# Patient Record
Sex: Female | Born: 1975 | Race: White | Hispanic: No | Marital: Married | Smoking: Current every day smoker
Health system: Southern US, Community
[De-identification: ages and names within clinical notes are randomized; demographics above are authoritative.]

## PROBLEM LIST (undated history)

## (undated) DIAGNOSIS — M549 Dorsalgia, unspecified: Secondary | ICD-10-CM

## (undated) DIAGNOSIS — R569 Unspecified convulsions: Secondary | ICD-10-CM

## (undated) DIAGNOSIS — F419 Anxiety disorder, unspecified: Secondary | ICD-10-CM

## (undated) DIAGNOSIS — I999 Unspecified disorder of circulatory system: Secondary | ICD-10-CM

## (undated) DIAGNOSIS — I82409 Acute embolism and thrombosis of unspecified deep veins of unspecified lower extremity: Secondary | ICD-10-CM

## (undated) DIAGNOSIS — F319 Bipolar disorder, unspecified: Secondary | ICD-10-CM

## (undated) DIAGNOSIS — G8929 Other chronic pain: Secondary | ICD-10-CM

## (undated) DIAGNOSIS — G4730 Sleep apnea, unspecified: Secondary | ICD-10-CM

## (undated) HISTORY — PX: SHOULDER SURGERY: SHX246

---

## 2012-11-26 ENCOUNTER — Emergency Department: Payer: Self-pay | Admitting: Emergency Medicine

## 2013-02-12 ENCOUNTER — Other Ambulatory Visit: Payer: Self-pay | Admitting: Physician Assistant

## 2013-02-12 LAB — CBC WITH DIFFERENTIAL/PLATELET
Basophil #: 0.1 10*3/uL (ref 0.0–0.1)
Eosinophil #: 0.2 10*3/uL (ref 0.0–0.7)
HCT: 45.2 % (ref 35.0–47.0)
HGB: 15.6 g/dL (ref 12.0–16.0)
Lymphocyte #: 2.4 10*3/uL (ref 1.0–3.6)
Lymphocyte %: 36.1 %
MCHC: 34.5 g/dL (ref 32.0–36.0)
MCV: 86 fL (ref 80–100)
Neutrophil %: 54.2 %
Platelet: 321 10*3/uL (ref 150–440)
RDW: 13.3 % (ref 11.5–14.5)
WBC: 6.5 10*3/uL (ref 3.6–11.0)

## 2013-02-12 LAB — COMPREHENSIVE METABOLIC PANEL
BUN: 6 mg/dL — ABNORMAL LOW (ref 7–18)
Bilirubin,Total: 0.2 mg/dL (ref 0.2–1.0)
Chloride: 104 mmol/L (ref 98–107)
Co2: 24 mmol/L (ref 21–32)
Creatinine: 0.77 mg/dL (ref 0.60–1.30)
EGFR (Non-African Amer.): >60
Glucose: 94 mg/dL (ref 65–99)
Osmolality: 267 (ref 275–301)
Potassium: 4.2 mmol/L (ref 3.5–5.1)
SGOT(AST): 86 U/L — ABNORMAL HIGH (ref 15–37)
Sodium: 135 mmol/L — ABNORMAL LOW (ref 136–145)
Total Protein: 7.9 g/dL (ref 6.4–8.2)

## 2013-02-12 LAB — PROTIME-INR: INR: 1.3

## 2013-05-17 LAB — CBC
HGB: 13.4 g/dL (ref 12.0–16.0)
MCH: 27.8 pg (ref 26.0–34.0)
MCHC: 33.2 g/dL (ref 32.0–36.0)
RBC: 4.82 10*6/uL (ref 3.80–5.20)

## 2013-05-17 LAB — BASIC METABOLIC PANEL
Anion Gap: 8 (ref 7–16)
Chloride: 106 mmol/L (ref 98–107)
Creatinine: 0.86 mg/dL (ref 0.60–1.30)
EGFR (Non-African Amer.): >60
Glucose: 87 mg/dL (ref 65–99)
Sodium: 136 mmol/L (ref 136–145)

## 2013-05-18 ENCOUNTER — Inpatient Hospital Stay: Payer: Self-pay | Admitting: Internal Medicine

## 2013-05-18 LAB — PROTIME-INR: Prothrombin Time: 15.1 secs — ABNORMAL HIGH (ref 11.5–14.7)

## 2013-05-18 LAB — PREGNANCY, URINE: Pregnancy Test, Urine: NEGATIVE m[IU]/mL

## 2013-05-19 LAB — CBC WITH DIFFERENTIAL/PLATELET
Basophil %: 0.6 %
Eosinophil #: 0.3 10*3/uL (ref 0.0–0.7)
Eosinophil %: 2.4 %
HCT: 33.1 % — ABNORMAL LOW (ref 35.0–47.0)
Lymphocyte #: 2.5 10*3/uL (ref 1.0–3.6)
Neutrophil #: 7.5 10*3/uL — ABNORMAL HIGH (ref 1.4–6.5)
Neutrophil %: 66.2 %
Platelet: 466 10*3/uL — ABNORMAL HIGH (ref 150–440)
RDW: 15.6 % — ABNORMAL HIGH (ref 11.5–14.5)
WBC: 11.3 10*3/uL — ABNORMAL HIGH (ref 3.6–11.0)

## 2013-05-19 LAB — BASIC METABOLIC PANEL WITH GFR
Anion Gap: 6 — ABNORMAL LOW (ref 7–16)
BUN: 7 mg/dL (ref 7–18)
Calcium, Total: 8.1 mg/dL — ABNORMAL LOW (ref 8.5–10.1)
Chloride: 104 mmol/L (ref 98–107)
Co2: 26 mmol/L (ref 21–32)
Creatinine: 0.89 mg/dL (ref 0.60–1.30)
EGFR (African American): >60
EGFR (Non-African Amer.): >60
Glucose: 90 mg/dL (ref 65–99)
Osmolality: 269 (ref 275–301)
Potassium: 3.5 mmol/L (ref 3.5–5.1)
Sodium: 136 mmol/L (ref 136–145)

## 2013-05-19 LAB — VANCOMYCIN, TROUGH: Vancomycin, Trough: 35 ug/mL (ref 10–20)

## 2013-05-20 LAB — CBC WITH DIFFERENTIAL/PLATELET
Basophil #: 0.1 10*3/uL (ref 0.0–0.1)
Basophil %: 0.5 %
Eosinophil %: 2.2 %
HGB: 9.3 g/dL — ABNORMAL LOW (ref 12.0–16.0)
Lymphocyte #: 2 10*3/uL (ref 1.0–3.6)
MCH: 27.9 pg (ref 26.0–34.0)
Monocyte #: 0.8 x10 3/mm (ref 0.2–0.9)
Monocyte %: 7.5 %
Platelet: 416 10*3/uL (ref 150–440)
RBC: 3.34 10*6/uL — ABNORMAL LOW (ref 3.80–5.20)
WBC: 10.6 10*3/uL (ref 3.6–11.0)

## 2013-05-20 LAB — BASIC METABOLIC PANEL
Calcium, Total: 6.5 mg/dL — CL (ref 8.5–10.1)
Co2: 27 mmol/L (ref 21–32)
Creatinine: 0.74 mg/dL (ref 0.60–1.30)
EGFR (African American): >60
EGFR (Non-African Amer.): >60
Glucose: 81 mg/dL (ref 65–99)
Osmolality: 275 (ref 275–301)
Sodium: 140 mmol/L (ref 136–145)

## 2013-05-21 LAB — BASIC METABOLIC PANEL
Anion Gap: 4 — ABNORMAL LOW (ref 7–16)
BUN: 4 mg/dL — ABNORMAL LOW (ref 7–18)
Calcium, Total: 8.2 mg/dL — ABNORMAL LOW (ref 8.5–10.1)
Chloride: 103 mmol/L (ref 98–107)
EGFR (African American): >60
EGFR (Non-African Amer.): >60

## 2013-05-22 LAB — VANCOMYCIN, TROUGH: Vancomycin, Trough: 13 ug/mL (ref 10–20)

## 2013-05-22 LAB — CULTURE, BLOOD (SINGLE)

## 2013-05-22 LAB — PROTIME-INR: Prothrombin Time: 13.4 secs (ref 11.5–14.7)

## 2013-05-24 LAB — PROTIME-INR
INR: 1.1
Prothrombin Time: 14.6 s

## 2013-05-24 LAB — CREATININE, SERUM
Creatinine: 0.77 mg/dL (ref 0.60–1.30)
EGFR (African American): >60

## 2013-05-24 LAB — HEMOGLOBIN: HGB: 11.2 g/dL — ABNORMAL LOW

## 2013-05-25 LAB — VANCOMYCIN, TROUGH: Vancomycin, Trough: 18 ug/mL (ref 10–20)

## 2013-05-26 LAB — BASIC METABOLIC PANEL
Anion Gap: 1 — ABNORMAL LOW (ref 7–16)
Calcium, Total: 8 mg/dL — ABNORMAL LOW (ref 8.5–10.1)
Co2: 34 mmol/L — ABNORMAL HIGH (ref 21–32)
Creatinine: 0.84 mg/dL (ref 0.60–1.30)
EGFR (Non-African Amer.): >60
Glucose: 92 mg/dL (ref 65–99)
Osmolality: 269 (ref 275–301)
Sodium: 136 mmol/L (ref 136–145)

## 2013-05-26 LAB — PLATELET COUNT: Platelet: 438 10*3/uL (ref 150–440)

## 2013-05-26 LAB — PROTIME-INR: INR: 1.5

## 2013-05-27 LAB — PROTIME-INR: Prothrombin Time: 20.7 secs — ABNORMAL HIGH (ref 11.5–14.7)

## 2013-05-28 LAB — PROTIME-INR
INR: 2
Prothrombin Time: 22.5 secs — ABNORMAL HIGH (ref 11.5–14.7)

## 2013-05-28 LAB — WOUND CULTURE

## 2013-05-28 LAB — HEMOGLOBIN: HGB: 11.2 g/dL — ABNORMAL LOW (ref 12.0–16.0)

## 2013-05-29 ENCOUNTER — Inpatient Hospital Stay: Payer: Self-pay | Admitting: Internal Medicine

## 2013-05-29 LAB — RAPID INFLUENZA A&B ANTIGENS

## 2013-05-29 LAB — COMPREHENSIVE METABOLIC PANEL
Albumin: 2.7 g/dL — ABNORMAL LOW (ref 3.4–5.0)
Alkaline Phosphatase: 84 U/L
Anion Gap: 4 — ABNORMAL LOW (ref 7–16)
BUN: 5 mg/dL — ABNORMAL LOW (ref 7–18)
Bilirubin,Total: 0.2 mg/dL (ref 0.2–1.0)
Calcium, Total: 8.5 mg/dL (ref 8.5–10.1)
Chloride: 98 mmol/L (ref 98–107)
Creatinine: 1 mg/dL (ref 0.60–1.30)
EGFR (Non-African Amer.): >60
Glucose: 100 mg/dL — ABNORMAL HIGH (ref 65–99)
Osmolality: 254 (ref 275–301)
SGOT(AST): 29 U/L (ref 15–37)
SGPT (ALT): 22 U/L (ref 12–78)
Total Protein: 7.9 g/dL (ref 6.4–8.2)

## 2013-05-29 LAB — CBC WITH DIFFERENTIAL/PLATELET
Basophil #: 0.1 10*3/uL (ref 0.0–0.1)
Basophil %: 0.8 %
HCT: 35.6 % (ref 35.0–47.0)
HGB: 11.8 g/dL — ABNORMAL LOW (ref 12.0–16.0)
Lymphocyte %: 10.5 %
MCHC: 33.2 g/dL (ref 32.0–36.0)
Monocyte %: 6.1 %
Neutrophil #: 10.3 10*3/uL — ABNORMAL HIGH (ref 1.4–6.5)
Neutrophil %: 81.6 %
RBC: 4.31 10*6/uL (ref 3.80–5.20)
WBC: 12.7 10*3/uL — ABNORMAL HIGH (ref 3.6–11.0)

## 2013-05-29 LAB — URINALYSIS, COMPLETE
Bilirubin,UR: NEGATIVE
Ketone: NEGATIVE
Nitrite: NEGATIVE
Ph: 5 (ref 4.5–8.0)
Protein: NEGATIVE
Specific Gravity: 1.017 (ref 1.003–1.030)
Squamous Epithelial: 2

## 2013-05-29 LAB — MAGNESIUM: Magnesium: 1.8 mg/dL

## 2013-05-29 LAB — PROTIME-INR: Prothrombin Time: 26.5 secs — ABNORMAL HIGH (ref 11.5–14.7)

## 2013-05-30 LAB — BASIC METABOLIC PANEL
BUN: 8 mg/dL (ref 7–18)
Chloride: 99 mmol/L (ref 98–107)
Creatinine: 1.18 mg/dL (ref 0.60–1.30)
EGFR (African American): >60
EGFR (Non-African Amer.): 58 — ABNORMAL LOW
Glucose: 126 mg/dL — ABNORMAL HIGH (ref 65–99)
Potassium: 3.6 mmol/L (ref 3.5–5.1)
Sodium: 134 mmol/L — ABNORMAL LOW (ref 136–145)

## 2013-05-30 LAB — CBC WITH DIFFERENTIAL/PLATELET
Basophil #: 0.1 10*3/uL (ref 0.0–0.1)
Basophil %: 0.8 %
Eosinophil #: 0.1 10*3/uL (ref 0.0–0.7)
HCT: 30.9 % — ABNORMAL LOW (ref 35.0–47.0)
MCH: 27.1 pg (ref 26.0–34.0)
Monocyte #: 1.4 x10 3/mm — ABNORMAL HIGH (ref 0.2–0.9)
Monocyte %: 10 %
Neutrophil #: 10.5 10*3/uL — ABNORMAL HIGH (ref 1.4–6.5)
RBC: 3.74 10*6/uL — ABNORMAL LOW (ref 3.80–5.20)
RDW: 15.6 % — ABNORMAL HIGH (ref 11.5–14.5)

## 2013-05-30 LAB — PROTIME-INR
INR: 3.4
Prothrombin Time: 33.3 secs — ABNORMAL HIGH (ref 11.5–14.7)

## 2013-05-31 LAB — CBC WITH DIFFERENTIAL/PLATELET
Basophil #: 0.1 10*3/uL (ref 0.0–0.1)
Basophil %: 0.8 %
Eosinophil #: 0.2 10*3/uL (ref 0.0–0.7)
Eosinophil %: 1.9 %
HCT: 30.4 % — ABNORMAL LOW (ref 35.0–47.0)
HGB: 10 g/dL — ABNORMAL LOW (ref 12.0–16.0)
Lymphocyte #: 1.7 10*3/uL (ref 1.0–3.6)
Lymphocyte %: 17.3 %
MCH: 27.2 pg (ref 26.0–34.0)
MCHC: 32.9 g/dL (ref 32.0–36.0)
MCV: 83 fL (ref 80–100)
Monocyte #: 0.7 x10 3/mm (ref 0.2–0.9)
Monocyte %: 7 %
Neutrophil #: 7.1 10*3/uL — ABNORMAL HIGH (ref 1.4–6.5)
Neutrophil %: 73 %
Platelet: 317 10*3/uL (ref 150–440)
RBC: 3.68 10*6/uL — ABNORMAL LOW (ref 3.80–5.20)
RDW: 15.8 % — ABNORMAL HIGH (ref 11.5–14.5)
WBC: 9.8 10*3/uL (ref 3.6–11.0)

## 2013-05-31 LAB — PROTIME-INR
INR: 2.7
Prothrombin Time: 28.1 secs — ABNORMAL HIGH (ref 11.5–14.7)

## 2013-05-31 LAB — URINE CULTURE

## 2013-05-31 LAB — PREGNANCY, URINE: PREGNANCY TEST, URINE: NEGATIVE m[IU]/mL

## 2013-06-01 LAB — PROTIME-INR
INR: 2.8
Prothrombin Time: 28.6 secs — ABNORMAL HIGH (ref 11.5–14.7)

## 2013-06-02 LAB — WOUND CULTURE

## 2013-06-03 LAB — CULTURE, BLOOD (SINGLE)

## 2013-06-04 ENCOUNTER — Emergency Department: Payer: Self-pay | Admitting: Surgery

## 2013-06-16 ENCOUNTER — Inpatient Hospital Stay: Payer: Self-pay | Admitting: Internal Medicine

## 2013-06-16 LAB — COMPREHENSIVE METABOLIC PANEL
ALBUMIN: 3.6 g/dL (ref 3.4–5.0)
ANION GAP: 10 (ref 7–16)
AST: 32 U/L (ref 15–37)
Alkaline Phosphatase: 74 U/L
BUN: 75 mg/dL — ABNORMAL HIGH (ref 7–18)
Bilirubin,Total: 0.7 mg/dL (ref 0.2–1.0)
CALCIUM: 8.3 mg/dL — AB (ref 8.5–10.1)
CHLORIDE: 98 mmol/L (ref 98–107)
CREATININE: 8.57 mg/dL — AB (ref 0.60–1.30)
Co2: 26 mmol/L (ref 21–32)
EGFR (Non-African Amer.): 5 — ABNORMAL LOW
GFR CALC AF AMER: 6 — AB
Glucose: 105 mg/dL — ABNORMAL HIGH (ref 65–99)
OSMOLALITY: 291 (ref 275–301)
POTASSIUM: 3.3 mmol/L — AB (ref 3.5–5.1)
SGPT (ALT): 91 U/L — ABNORMAL HIGH (ref 12–78)
Sodium: 134 mmol/L — ABNORMAL LOW (ref 136–145)
Total Protein: 8.2 g/dL (ref 6.4–8.2)

## 2013-06-16 LAB — CBC WITH DIFFERENTIAL/PLATELET
BASOS PCT: 1.8 %
Basophil #: 0.1 10*3/uL (ref 0.0–0.1)
EOS PCT: 4.5 %
Eosinophil #: 0.3 10*3/uL (ref 0.0–0.7)
HCT: 33.5 % — ABNORMAL LOW (ref 35.0–47.0)
HGB: 11.2 g/dL — ABNORMAL LOW (ref 12.0–16.0)
Lymphocyte #: 1 10*3/uL (ref 1.0–3.6)
Lymphocyte %: 17.6 %
MCH: 26.3 pg (ref 26.0–34.0)
MCHC: 33.4 g/dL (ref 32.0–36.0)
MCV: 79 fL — ABNORMAL LOW (ref 80–100)
MONOS PCT: 10.2 %
Monocyte #: 0.6 x10 3/mm (ref 0.2–0.9)
NEUTROS PCT: 65.9 %
Neutrophil #: 3.8 10*3/uL (ref 1.4–6.5)
Platelet: 295 10*3/uL (ref 150–440)
RBC: 4.25 10*6/uL (ref 3.80–5.20)
RDW: 17.5 % — ABNORMAL HIGH (ref 11.5–14.5)
WBC: 5.7 10*3/uL (ref 3.6–11.0)

## 2013-06-16 LAB — URINALYSIS, COMPLETE
Bilirubin,UR: NEGATIVE
Glucose,UR: NEGATIVE mg/dL (ref 0–75)
KETONE: NEGATIVE
Leukocyte Esterase: NEGATIVE
Nitrite: NEGATIVE
Ph: 6 (ref 4.5–8.0)
Protein: 30
RBC,UR: 2 /HPF (ref 0–5)
Specific Gravity: 1.011 (ref 1.003–1.030)
Squamous Epithelial: 2

## 2013-06-16 LAB — PREGNANCY, URINE: Pregnancy Test, Urine: NEGATIVE m[IU]/mL

## 2013-06-17 LAB — CBC WITH DIFFERENTIAL/PLATELET
BASOS ABS: 0.1 10*3/uL (ref 0.0–0.1)
BASOS PCT: 2.3 %
EOS ABS: 0.3 10*3/uL (ref 0.0–0.7)
Eosinophil %: 4.2 %
HCT: 32.6 % — ABNORMAL LOW (ref 35.0–47.0)
HGB: 10.7 g/dL — ABNORMAL LOW (ref 12.0–16.0)
Lymphocyte #: 1.7 10*3/uL (ref 1.0–3.6)
Lymphocyte %: 28.6 %
MCH: 25.9 pg — ABNORMAL LOW (ref 26.0–34.0)
MCHC: 32.9 g/dL (ref 32.0–36.0)
MCV: 79 fL — ABNORMAL LOW (ref 80–100)
Monocyte #: 0.7 x10 3/mm (ref 0.2–0.9)
Monocyte %: 11.5 %
NEUTROS ABS: 3.3 10*3/uL (ref 1.4–6.5)
NEUTROS PCT: 53.4 %
Platelet: 287 10*3/uL (ref 150–440)
RBC: 4.15 10*6/uL (ref 3.80–5.20)
RDW: 17.3 % — ABNORMAL HIGH (ref 11.5–14.5)
WBC: 6.1 10*3/uL (ref 3.6–11.0)

## 2013-06-17 LAB — BASIC METABOLIC PANEL
Anion Gap: 11 (ref 7–16)
BUN: 62 mg/dL — ABNORMAL HIGH (ref 7–18)
CALCIUM: 7.8 mg/dL — AB (ref 8.5–10.1)
Chloride: 102 mmol/L (ref 98–107)
Co2: 23 mmol/L (ref 21–32)
Creatinine: 7.36 mg/dL — ABNORMAL HIGH (ref 0.60–1.30)
EGFR (African American): 7 — ABNORMAL LOW
EGFR (Non-African Amer.): 6 — ABNORMAL LOW
Glucose: 92 mg/dL (ref 65–99)
Osmolality: 289 (ref 275–301)
Potassium: 3.4 mmol/L — ABNORMAL LOW (ref 3.5–5.1)
SODIUM: 136 mmol/L (ref 136–145)

## 2013-06-17 LAB — URINALYSIS, COMPLETE
Bacteria: NONE SEEN
Bilirubin,UR: NEGATIVE
Glucose,UR: NEGATIVE mg/dL (ref 0–75)
Ketone: NEGATIVE
Leukocyte Esterase: NEGATIVE
Nitrite: NEGATIVE
Ph: 6 (ref 4.5–8.0)
Protein: NEGATIVE
RBC,UR: 1 /HPF (ref 0–5)
SPECIFIC GRAVITY: 1.009 (ref 1.003–1.030)
Squamous Epithelial: 1

## 2013-06-17 LAB — MAGNESIUM: MAGNESIUM: 1.8 mg/dL

## 2013-06-17 LAB — PROTIME-INR
INR: 1.4
Prothrombin Time: 16.6 secs — ABNORMAL HIGH (ref 11.5–14.7)

## 2013-06-18 LAB — BASIC METABOLIC PANEL
ANION GAP: 10 (ref 7–16)
BUN: 47 mg/dL — AB (ref 7–18)
CHLORIDE: 103 mmol/L (ref 98–107)
CO2: 22 mmol/L (ref 21–32)
CREATININE: 5.8 mg/dL — AB (ref 0.60–1.30)
Calcium, Total: 7.6 mg/dL — ABNORMAL LOW (ref 8.5–10.1)
EGFR (African American): 10 — ABNORMAL LOW
GFR CALC NON AF AMER: 9 — AB
Glucose: 81 mg/dL (ref 65–99)
Osmolality: 281 (ref 275–301)
Potassium: 3.2 mmol/L — ABNORMAL LOW (ref 3.5–5.1)
Sodium: 135 mmol/L — ABNORMAL LOW (ref 136–145)

## 2013-06-18 LAB — CBC WITH DIFFERENTIAL/PLATELET
BASOS PCT: 2.5 %
Basophil #: 0.1 10*3/uL (ref 0.0–0.1)
EOS PCT: 3.9 %
Eosinophil #: 0.2 10*3/uL (ref 0.0–0.7)
HCT: 31.2 % — ABNORMAL LOW (ref 35.0–47.0)
HGB: 10.1 g/dL — ABNORMAL LOW (ref 12.0–16.0)
LYMPHS ABS: 1.7 10*3/uL (ref 1.0–3.6)
LYMPHS PCT: 32.2 %
MCH: 26.4 pg (ref 26.0–34.0)
MCHC: 32.5 g/dL (ref 32.0–36.0)
MCV: 81 fL (ref 80–100)
MONOS PCT: 9.6 %
Monocyte #: 0.5 x10 3/mm (ref 0.2–0.9)
NEUTROS ABS: 2.7 10*3/uL (ref 1.4–6.5)
Neutrophil %: 51.8 %
Platelet: 235 10*3/uL (ref 150–440)
RBC: 3.84 10*6/uL (ref 3.80–5.20)
RDW: 17.1 % — ABNORMAL HIGH (ref 11.5–14.5)
WBC: 5.2 10*3/uL (ref 3.6–11.0)

## 2013-06-19 LAB — BASIC METABOLIC PANEL
Anion Gap: 8 (ref 7–16)
BUN: 35 mg/dL — AB (ref 7–18)
CHLORIDE: 105 mmol/L (ref 98–107)
CO2: 24 mmol/L (ref 21–32)
Calcium, Total: 7.7 mg/dL — ABNORMAL LOW (ref 8.5–10.1)
Creatinine: 4.61 mg/dL — ABNORMAL HIGH (ref 0.60–1.30)
EGFR (African American): 13 — ABNORMAL LOW
GFR CALC NON AF AMER: 11 — AB
Glucose: 97 mg/dL (ref 65–99)
OSMOLALITY: 282 (ref 275–301)
Potassium: 3 mmol/L — ABNORMAL LOW (ref 3.5–5.1)
SODIUM: 137 mmol/L (ref 136–145)

## 2013-06-19 LAB — UR PROT ELECTROPHORESIS, URINE RANDOM

## 2013-06-19 LAB — MAGNESIUM: Magnesium: 1.3 mg/dL — ABNORMAL LOW

## 2013-06-20 LAB — BASIC METABOLIC PANEL
Anion Gap: 8 (ref 7–16)
BUN: 29 mg/dL — ABNORMAL HIGH (ref 7–18)
CHLORIDE: 104 mmol/L (ref 98–107)
CO2: 21 mmol/L (ref 21–32)
Calcium, Total: 8.6 mg/dL (ref 8.5–10.1)
Creatinine: 3.78 mg/dL — ABNORMAL HIGH (ref 0.60–1.30)
EGFR (Non-African Amer.): 14 — ABNORMAL LOW
GFR CALC AF AMER: 17 — AB
Glucose: 93 mg/dL (ref 65–99)
OSMOLALITY: 272 (ref 275–301)
Potassium: 3.3 mmol/L — ABNORMAL LOW (ref 3.5–5.1)
SODIUM: 133 mmol/L — AB (ref 136–145)

## 2013-06-20 LAB — PATHOLOGY REPORT

## 2013-06-20 LAB — MAGNESIUM: Magnesium: 2.2 mg/dL

## 2013-06-21 LAB — CULTURE, BLOOD (SINGLE)

## 2013-06-21 LAB — BASIC METABOLIC PANEL
Anion Gap: 7 (ref 7–16)
BUN: 26 mg/dL — ABNORMAL HIGH (ref 7–18)
CO2: 25 mmol/L (ref 21–32)
Calcium, Total: 8.6 mg/dL (ref 8.5–10.1)
Chloride: 103 mmol/L (ref 98–107)
Creatinine: 3.49 mg/dL — ABNORMAL HIGH (ref 0.60–1.30)
EGFR (African American): 18 — ABNORMAL LOW
GFR CALC NON AF AMER: 16 — AB
GLUCOSE: 104 mg/dL — AB (ref 65–99)
OSMOLALITY: 275 (ref 275–301)
Potassium: 3.3 mmol/L — ABNORMAL LOW (ref 3.5–5.1)
Sodium: 135 mmol/L — ABNORMAL LOW (ref 136–145)

## 2013-06-21 LAB — LIPASE, BLOOD: Lipase: 933 U/L — ABNORMAL HIGH (ref 73–393)

## 2013-06-21 LAB — PROTEIN ELECTROPHORESIS(ARMC)

## 2013-06-22 LAB — CREATININE, SERUM
Creatinine: 3.3 mg/dL — ABNORMAL HIGH (ref 0.60–1.30)
EGFR (Non-African Amer.): 17 — ABNORMAL LOW
GFR CALC AF AMER: 20 — AB

## 2013-06-22 LAB — LIPASE, BLOOD: LIPASE: 737 U/L — AB (ref 73–393)

## 2013-06-22 LAB — TRIGLYCERIDES: Triglycerides: 253 mg/dL — ABNORMAL HIGH (ref 0–200)

## 2013-08-05 ENCOUNTER — Emergency Department: Payer: Self-pay | Admitting: Internal Medicine

## 2013-08-05 LAB — CBC
HCT: 40 % (ref 35.0–47.0)
HGB: 13.4 g/dL (ref 12.0–16.0)
MCH: 28.1 pg (ref 26.0–34.0)
MCHC: 33.6 g/dL (ref 32.0–36.0)
MCV: 84 fL (ref 80–100)
Platelet: 323 10*3/uL (ref 150–440)
RBC: 4.78 10*6/uL (ref 3.80–5.20)
RDW: 18.4 % — ABNORMAL HIGH (ref 11.5–14.5)
WBC: 5.5 10*3/uL (ref 3.6–11.0)

## 2013-08-05 LAB — COMPREHENSIVE METABOLIC PANEL
ALK PHOS: 103 U/L
ALT: 15 U/L (ref 12–78)
ANION GAP: 9 (ref 7–16)
AST: 26 U/L (ref 15–37)
Albumin: 3.2 g/dL — ABNORMAL LOW (ref 3.4–5.0)
BUN: 9 mg/dL (ref 7–18)
Bilirubin,Total: 0.9 mg/dL (ref 0.2–1.0)
Calcium, Total: 8.8 mg/dL (ref 8.5–10.1)
Chloride: 106 mmol/L (ref 98–107)
Co2: 19 mmol/L — ABNORMAL LOW (ref 21–32)
Creatinine: 0.88 mg/dL (ref 0.60–1.30)
EGFR (African American): >60
Glucose: 91 mg/dL (ref 65–99)
Osmolality: 267 (ref 275–301)
POTASSIUM: 4.5 mmol/L (ref 3.5–5.1)
Sodium: 134 mmol/L — ABNORMAL LOW (ref 136–145)
Total Protein: 7.9 g/dL (ref 6.4–8.2)

## 2013-08-05 LAB — URINALYSIS, COMPLETE
BACTERIA: NONE SEEN
BILIRUBIN, UR: NEGATIVE
BLOOD: NEGATIVE
Glucose,UR: NEGATIVE mg/dL (ref 0–75)
Ketone: NEGATIVE
Leukocyte Esterase: NEGATIVE
NITRITE: NEGATIVE
Ph: 6 (ref 4.5–8.0)
Protein: NEGATIVE
Specific Gravity: 1.017 (ref 1.003–1.030)
Squamous Epithelial: 11
WBC UR: 3 /HPF (ref 0–5)

## 2013-08-05 LAB — LIPASE, BLOOD: Lipase: 101 U/L (ref 73–393)

## 2013-09-13 ENCOUNTER — Ambulatory Visit: Payer: Self-pay | Admitting: Surgery

## 2013-10-01 ENCOUNTER — Ambulatory Visit: Payer: Self-pay | Admitting: Orthopaedic Surgery

## 2013-10-29 ENCOUNTER — Ambulatory Visit: Payer: Self-pay | Admitting: Internal Medicine

## 2014-09-20 NOTE — Op Note (Signed)
PATIENT NAME:  Courtney BambergerFOSTER, Brighid V MR#:  [id number] DATE OF BIRTH:  [birth year]  DATE OF PROCEDURE:  05/23/2013  PREOPERATIVE DIAGNOSIS: Multiple skin abscesses.  POSTOPERATIVE DIAGNOSIS: Multiple skin abscesses.  OPERATION:  1.  wound VAC change, right arm.  2.  Multiple dressing changes.  3.  Incision and drainage to her left chest wall abscess.   ANESTHESIA: General.   SURGEON: Quentin Orealph L. Ely, MD   OPERATIVE PROCEDURE: With the patient in the supine position after the induction of appropriate general anesthesia, the patient's multiple abscesses were undressed and prepped with Betadine and wound dressings accomplished. The foot wound appeared to be completely filled in other than the epithelium and so it was dressed with a wet-to-dry dressing. We will transition to a DuoDERM dressing. The right upper arm wound VAC was changed in the standard fashion. She had a new abscess in the left forearm along the left chest, both of which were incised, drained, packed and cultured. The patient was awakened and returned to the recovery room and tolerated the procedure well. Sponge, instrument and needle counts were correct x 2 in the operating room.   ____________________________ Quentin Orealph L. Ely III, MD rle:aw D: 05/23/2013 11:08:00 ET T: 05/23/2013 11:15:15 ET JOB#: [id number]  cc: Quentin Orealph L. Ely III, MD, <Dictator> Quentin OreALPH L ELY MD ELECTRONICALLY SIGNED 05/23/2013 17:30

## 2014-09-20 NOTE — Consult Note (Signed)
Brief Consult Note: Diagnosis: multiple MRSA abscesses.   Patient was seen by consultant.   Recommend to proceed with surgery or procedure.   Comments: Very pleasant MO pt with multiple MRSA abscesses. The two drained in the ED: R upper arm, and R ankle dorsal surface both have significant amount of skin necrosis, and would benefit from operative debridement and further drainage. She has new, non-necrotic abscesses on L foot, L groin, and R upper arm (separate from necrotic one). The groin has spontaneously (and adequately) drained, but the other two may benefit from drainage as well.  Pt just ate, and will require general anesthesia, so posted in OR for tomorrow. I will let my partner, Dr. Egbert GaribaldiBird decide on dressing(s) vs wound VAC(s), as he will be doing the procedure..  Electronic Signatures: Claude MangesMarterre, Carnie Bruemmer F (MD)  (Signed 19-Dec-14 20:57)  Authored: Brief Consult Note   Last Updated: 19-Dec-14 20:57 by Claude MangesMarterre, Rhianna Raulerson F (MD)

## 2014-09-20 NOTE — Consult Note (Signed)
PATIENT NAME:  Courtney Hebert, Courtney Hebert MR#:  [id number] DATE OF BIRTH:  1975/07/16  DATE OF CONSULTATION:  05/30/2013  CONSULTING PHYSICIAN:  Cristal Deerhristopher A. Lorraine Cimmino, MD  REASON FOR CONSULTATION: Persistent fever, leukocytosis, right foot and right arm abscesses which had previously been drained.   HISTORY OF PRESENT ILLNESS: Ms. Courtney Hebert is a pleasant 39 year old female who was admitted from December 18th until December 29th with MRSA skin infections which had been I and D'd and wound VAC placed. She was discharged to home on the 29th with IV vancomycin and PICC line, and she returns with fever. She otherwise has been doing fine. Her right foot wound has been getting red and was oozing, according to the boyfriend. Otherwise, no issues. No chest pain, shortness of breath, cough, headache, abdominal pain, nausea, vomiting, diarrhea, constipation, dysuria or hematuria.   PAST MEDICAL HISTORY:  1. DVT, on chronic anticoagulation with warfarin.  2. History of recurrent skin infection with MRSA.  3. History of C-section.  4. History of left arm orthopedic surgery.   HOME MEDICATIONS:  1. Oxycodone.  2. Coumadin.  3. Xanax.  4. Nystatin powder to groin.  5. Colace. 6. Nicotine patch. 7. Vancomycin IV. 8. Morphine.   FAMILY HISTORY: Significant for CVA.   SOCIAL HISTORY: Tobacco use 1-1/2 to 2 packs a day. Denies alcohol or other drugs.   REVIEW OF SYSTEMS: A 12-point review of systems  obtained. Pertinent positives and negatives as above.   PHYSICAL EXAMINATION:  VITAL SIGNS: Temperature 99.2, pulse 104, blood pressure 122/86, respirations 18, 96% on room air.  GENERAL: In no acute distress. Alert and oriented x3.  HEENT: Head: Normocephalic, atraumatic. Eyes: No scleral icterus. No conjunctivitis. Face: No obvious facial trauma. Normal external nose. Normal external ears.  CHEST: Lungs clear to auscultation, moving air well.  HEART: Regular rate and rhythm. No murmurs, rubs or  gallops. ABDOMEN: Soft, nontender, nondistended.  EXTREMITIES: Has a wound VAC in place on the right medial arm. No obvious redness or drainage. Relatively nontender. Foot has an approximately 1 x 1 cm area of granulation with fibrinous exudate. No swelling. No erythema. No obvious abscess.   LABORATORY DATA: As follows: White cell count 14.2, otherwise unremarkable.   ASSESSMENT AND PLAN: Ms. Courtney Hebert is a pleasant 39 year old female with history of recurrent MRSA abscesses. She is currently afebrile, and her right foot wound is not of concern. I will change her right arm wound later today to evaluate for any need for further debridement. Will continue to follow.   ____________________________ Si Raiderhristopher A. Theophilus Walz, MD cal:lb D: 05/30/2013 10:04:43 ET T: 05/30/2013 10:13:22 ET JOB#: [id number]  cc: Cristal Deerhristopher A. Shamiyah Ngu, MD, <Dictator> Jarvis NewcomerHRISTOPHER A Cornell Bourbon MD ELECTRONICALLY SIGNED 05/30/2013 16:11

## 2014-09-20 NOTE — Consult Note (Signed)
PATIENT NAME:  Courtney BambergerFOSTER, Kemesha V MR#:  [id number] DATE OF BIRTH:  01-Dec-1975  DATE OF CONSULTATION:  05/27/2013  REFERRING PHYSICIAN:   CONSULTING PHYSICIAN:  Tomie Spizzirri K. Kerah Hardebeck, MD  SUBJECTIVE:  The patient was seen in consultation in room number 120 along with her fiance.  The patient is a 39 year old white female, not employed and is divorced and currently she has a fiance who is 40 years old and has been living with him.  Has 2 children and they live with her parents.  The patient has a long history of mental illness and was with diagnosed bipolar disorder, depressed, and was being followed by psychiatrist.    PAST PSYCHIATRIC HISTORY:  History of inpatient psychiatry 3 times before.  Each inpatient lasted for 2 weeks.  History of suicide attempt twice by overdose of prescription pills.    MEDICATIONS:  The patient has been stabilized on the following medications:   1.  Lamictal 150 mg p.o. h.s.  2.  Lithium 300 mg at h.s.   ALCOHOL AND DRUGS:  Denied.  Does admit smoking nicotine cigarettes at rate of pack a day for many years.    The patient reports that she is not being followed by any psychiatrist in the community as [place] and [place] and currently she has been living with her f[place] for [place].    MENTAL STATUS EXAMINATION:  The patient was seen laying in bed comfortably.  Affect is appropriate with her mood which is stable.  She reports that she has been taking medication which she has at home.  Denies feeling depressed, denies feeling hopeless or helpless.  Denies feeling worthless or useless.  Denies any ideas or plans to hurt herself or others.  No psychosis.  Denies auditory or visual hallucinations.  Denies hearing voices or seeing things.  Denies paranoia or suspicious ideas.  Denies any thought insertion or thought control.  Memory is intact, cognition intact.  General information fair.  Insight and judgment fair and  adequate.  IMPRESSION:  Bipolar disorder, type depressed.  RECOMMENDATIONS:  The patient should be continued on lithium 300 mg at bedtime and Lamictal 150 mg at bedtime.  The patient is to be given followup appointment in the community by [facility] at the time of discharge.    ____________________________ Jannet MantisSurya K. Guss Bundehalla, MD skc:cs D: 05/27/2013 16:10:59 ET T: 05/27/2013 19:31:33 ET JOB#: [id number]  cc: Monika SalkSurya K. Guss Bundehalla, MD, <Dictator> Beau FannySURYA K Rafaela Dinius MD ELECTRONICALLY SIGNED 05/28/2013 9:20

## 2014-09-20 NOTE — Op Note (Signed)
PATIENT NAME:  Courtney Hebert, Courtney Hebert MR#:  [id number] DATE OF BIRTH:  06/25/1975  DATE OF PROCEDURE:  05/19/2013  PREOPERATIVE DIAGNOSIS:  Multiple methicillin resistant staphylococcus aureus abscesses, right upper arm, right lower leg, right foot, left leg, left thigh, left forearm, left arm.  POSTOPERATIVE DIAGNOSIS:  Multiple methicillin resistant staphylococcus aureus abscesses, right upper arm, right lower leg, right foot, left leg, left thigh, left forearm, left arm.   PROCEDURE PERFORMED:   1.  Placement of a wound vac less than 50 sq cm total, right upper arm.  2.  Placement of wound vac right anterior foot, right upper arm measures 7 x 3 cm, right anterior ankle 3.5 cm x 3.5 cm with multiple incision and drainage of multiple abscesses, right upper arm, right leg, right thigh, left leg, left thigh, left arm, left forearm, totaling 18.   SURGEON:  Natale LayMark Artemio Dobie, M.D.   ASSISTANT:  None.   ANESTHESIA:  General endotracheal.   DESCRIPTION OF PROCEDURE:  With informed consent, supine position, general anesthesia, the patient's existing right upper extremity necrotic abscess cavity in the right upper arm was sterilely prepped and draped along with several others in the vicinity.  Timeout was observed.  The existing dressing was removed along with necrotic eschar with sharp technique.  This wound measured 7 x 3 cm.  It was irrigated.  Hemostasis was obtained.  Black granular foam was then placed followed by occlusive dressing and track pad.  Several other abscesses on the right upper arm were drained utilizing an #11 blade in a cruciate fashion followed by 0.25 inch iodoform gauze packing, Kerlix and Ioban.  Attention was then turned to the right anterior dorsal foot wound which measured 3.5 x 3.5 cm.  This was unroofed of necrotic material and pus.  We irrigated, hemostasis obtained, black granular foam was then placed.  Seal was obtained.  Following this, multiple further abscesses were drained all in  similar fashion utilizing a cruciate incision, an #11 blade, hemostat, iodoform gauze.  This totaled 18 on the entire body.  The patient was then returned to the recovery room in stable and satisfactory condition by anesthesia services.     ____________________________ Redge GainerMark A. Egbert GaribaldiBird, MD Verta Ellenmab:ea D: 05/19/2013 16:11:31 ET T: 05/20/2013 02:41:52 ET JOB#: [id number]  cc: Loraine LericheMark A. Egbert GaribaldiBird, MD, <Dictator> Raynald KempMARK A Lyniah Fujita MD ELECTRONICALLY SIGNED 05/25/2013 11:16

## 2014-09-20 NOTE — H&P (Signed)
PATIENT NAME:  Courtney Hebert, Courtney Hebert MR#:  [id number] DATE OF BIRTH:  [birth year]  DATE OF ADMISSION:  05/18/2013  REFERRING PHYSICIAN: Dr. Cyril Loosen.    PRIMARY CARE PHYSICIAN: Dr. Beverely Risen from [facility].   CHIEF COMPLAINT: Abscess.   HISTORY OF PRESENT ILLNESS: This is a 39 year old female with a history of MRSA, recurrent abscesses and skin infection due to that, history of DVT who presents with multiple abscesses for the last few days. The patient reports she started to have an abscess on the left upper extremity, right lower extremity and to a lesser degree on the left lower extremity as well.  over the last few days. The patient reports she is known to have history of MRSA with history of abscess which was drained in the past but reports most recent was a year ago, but she has been having recurrent skin boils and infection due to that. In the ED, the patient was afebrile but had leukocytosis at 14.8. The patient had I and D done in the right arm and right ankle area by ED physician. Hospitalist service was requested to admit the patient due to cellulitis and further need for IV antibiotics. The patient was afebrile in the ED, but she reports history of fever. She denies cough, dysuria, polyuria, hemoptysis, productive sputum, abdominal pain, chest pain or worsening lower extremity edema.   PAST MEDICAL HISTORY:  1. History of recurrent DVT, on chronic anticoagulation with warfarin.  2. History of recurrent skin infection due to MRSA.   PAST SURGICAL HISTORY:  1. History of C-section.  2. Left arm orthopedic surgery.   FAMILY HISTORY: Significant for CVA.   SOCIAL HISTORY: The patient smokes 1/2 to 1 pack per day. Denies any illicit drug use or any alcohol. Currently unemployed.   HOME MEDICATIONS:  1. Warfarin 10 mg oral daily but reports she has not been taking it for the last week as she has been having her period.  2. Percocet 5/325 every 6 hours as needed.  3. Xanax 1 mg oral 3  times a day as needed.  4. Robaxin, dose unknown, oral daily.   REVIEW OF SYSTEMS:  CONSTITUTIONAL: The patient reports fever. Denies any chills, fatigue, weakness, weight gain, weight loss.  EYES: Denies blurry vision, double vision, inflammation, glaucoma.  ENT: Denies tinnitus, ear pain, epistaxis or discharge.  RESPIRATORY: Denies cough, wheezing, hemoptysis, COPD.   CARDIOVASCULAR: Denies chest pain, edema, arrhythmia, palpitation, syncope.  GASTROINTESTINAL: Denies nausea, vomiting, diarrhea, abdominal pain, hematemesis, melena, rectal bleed.  GENITOURINARY: Denies dysuria, hematuria, renal colic.  ENDOCRINE: Denies polyuria, polydipsia, heat or cold intolerance.  HEMATOLOGY: Denies anemia, easy bruising, bleeding diathesis.  INTEGUMENTARY: Denies any acne. Reports multiple skin infections due to MRSA in the past, with multiple boils and abscesses currently.  MUSCULOSKELETAL: Denies any gout, swelling, arthritis or cramps.  NEUROLOGIC: Denies CVA, TIA, headache vertigo tremor.  PSYCHIATRIC: Denies insomnia, depression. Reports history of anxiety.   PHYSICAL EXAMINATION:  VITAL SIGNS: Temperature 98.1, pulse 84, respiratory rate 18, blood pressure 141/76, saturating 98% on room air.  GENERAL: Well-nourished female, looks comfortable in bed, in no apparent distress.  HEENT: Head atraumatic, normocephalic. Pupils equal, reactive to light. Pink conjunctivae. Anicteric sclerae. Moist oral mucosa.  NECK: Supple. No thyromegaly. No JVD.  CHEST: Good air entry bilaterally. No wheezing, rales, rhonchi.  CARDIOVASCULAR: S1, S2 heard. No rubs, murmurs or gallops.  ABDOMEN: Soft, nontender, nondistended. Bowel sounds present.  EXTREMITIES: No edema. No clubbing. No cyanosis.  PSYCHIATRIC: Appropriate  affect. Awake, alert x 3. Intact judgment and insight.  NEUROLOGIC: Cranial nerves grossly intact. Motor 5 out of 5. No focal deficits.  SKIN: The patient has multiple boils, mainly in her 4  extremities. Status post I and D in the right arm with central area of eschar and with surrounding erythema as well in the right ankle. In the left foot, the patient had mild swelling with the skin warm and tender to palpation.   PERTINENT LABORATORIES: Glucose 87, BUN 10, creatinine 0.86, sodium 136, potassium 3.3, chloride 106, CO2 22. White blood cells 14.8, hemoglobin 13.4, hematocrit 40.3, platelets 513.   ASSESSMENT AND PLAN:  1. Abscess and cellulitis: The patient has multiple sites of abscess and cellulitis. The major 2 sites are status post incision and drainage in the Emergency Department. The patient was started on intravenous vancomycin due to her history of methicillin-resistant Staphylococcus aureus. Will consult surgical service as there might be further need for debridement of those sites as well as further incision and drainage of the skin infection in the left foot. Will consult pharmacy to dose her vancomycin.  2. History of recurrent deep vein thrombosis: The patient has been off her warfarin for the last week. Will check INR. If her level is subtherapeutic, will start her on Lovenox as it has shorter half life in case she will need surgery.  3. Tobacco abuse: The patient was counseled. Will be started on NicoDerm patch.  4. Deep vein thrombosis prophylaxis: Will check the patient's INR and depending on the level, she will need to be started on full-dose anticoagulation.   TOTAL TIME SPENT ON ADMISSION AND PATIENT CARE: 45 minutes.   ____________________________ Starleen Armsawood S. Seleen Walter, MD dse:gb D: 05/18/2013 04:42:29 ET T: 05/18/2013 05:32:04 ET JOB#: [id number]  cc: Starleen Armsawood S. Melanni Benway, MD, <Dictator> Neilani Duffee Teena IraniS Leyah Bocchino MD ELECTRONICALLY SIGNED 05/18/2013 6:56

## 2014-09-21 NOTE — Consult Note (Signed)
PATIENT NAME:  Courtney BambergerFOSTER, Travia V MR#:  [id number] DATE OF BIRTH:  Nov 28, 1975  DATE OF CONSULTATION:  06/04/2013  REFERRING PHYSICIAN:   CONSULTING PHYSICIAN:  Adah Salvageichard E. Excell Seltzerooper, MD  CHIEF COMPLAINT: Bleeding, right arm wound.  HISTORY OF PRESENT ILLNESS: This is a patient status post I and D of multiple skin abscesses with MRSA. I was called by the home health nurse who was changing the wound VAC on her right arm and encountered bleeding that she could not easily control with pressure.  She placed a temporary pressure dressing on and I asked her to send the patient directly to the Emergency Room. I saw the patient in the ER where she was describing ongoing generalized pain, but no further bleeding through the dressing.  PAST MEDICAL AND SURGICAL HISTORY: Reviewed in the chart.   ALLERGIES: None.   MEDICATIONS: Reviewed. She is currently using oxycodone and requesting morphine pills as well.   PHYSICAL EXAMINATION: GENERAL: Very groggy-appearing female patient. She acts intoxicated. VITAL SIGNS: Temperature 98.2, pulse 97, respirations 18, blood pressure 112/56, pain scale of 6 and 93% room air sat.  HEENT: No scleral icterus.  LUNGS: CTA.  HEART: RRR.  ABDOMEN: Soft, nontender.  SKIN: Multiple wounds of different age and evolution on her lower extremities, arms. On the right upper arm there is a dressing. That dressing is removed. Shallow granulation tissue is present. There is minor venous bleeding, which is only present a portion of the time and responds easily to pressure.   ASSESSMENT AND PLAN: Bleeding from right arm wound, very minimal at this point. I placed a Xeroform dressing and asked them not to change that dressing but to reinforce it if necessary and follow up in my office tomorrow afternoon at which time I will personally change the dressing and review this as well.   Of note, the patient appears very groggy on oxycodone and is requesting additional morphine which I refused to  fill as she is quite groggy at this point. She was asking if she could drive and I suggested that she is taking narcotics and should not drive at all and her husband or significant other stated that he would drive her as needed.  ____________________________ Adah Salvageichard E. Excell Seltzerooper, MD rec:sb D: 06/04/2013 15:26:08 ET T: 06/04/2013 15:56:21 ET JOB#: [id number]  cc: Adah Salvageichard E. Excell Seltzerooper, MD, <Dictator> Lattie HawICHARD E Moriya Mitchell MD ELECTRONICALLY SIGNED 06/04/2013 18:24

## 2014-09-21 NOTE — Consult Note (Signed)
Details:   - GI follow up:  S: feels better today. N/v today after eating yogurt.  No f/c.  Some crampy abd pain  VSS a and o x 4 cta, no w/c rrr, no m/r/g obese, nabs, soft, nt  A/P:  PUD - cont protonix 40 mg BID for 8 weeks, then 40 daily - carafate slurry for 2 weeks - requesting phenergan for nausea management as outpatient.  - no futher recs at this time.   Electronic Signatures: Dow Adolphein, Jamirra Curnow (MD)  (Signed 21-Jan-15 17:24)  Authored: Details   Last Updated: 21-Jan-15 17:24 by Dow Adolphein, Devora Tortorella (MD)

## 2014-09-21 NOTE — Consult Note (Signed)
Brief Consult Note: Diagnosis: n/v, epigastric pain.   Patient was seen by consultant.   Consult note dictated.   Recommend to proceed with surgery or procedure.   Recommend further assessment or treatment.   Orders entered.   Comments: Will add on carafate, GI cocktail. If no improvement, will plan for EGD on Tuesday.  Npo past midnight on Monday night.  Electronic Signatures: Dow Adolphein, Ritaj Dullea (MD)  (Signed 18-Jan-15 17:12)  Authored: Brief Consult Note   Last Updated: 18-Jan-15 17:12 by Dow Adolphein, Daymon Hora (MD)

## 2014-09-21 NOTE — Op Note (Signed)
PATIENT NAME:  Courtney Hebert, Courtney Hebert MR#:  [id number] DATE OF BIRTH:  1976/04/27  DATE OF PROCEDURE:  05/26/2013  PREOPERATIVE DIAGNOSIS: Multiple skin abscesses.   POSTOPERATIVE DIAGNOSIS:  Multiple skin abscesses.   OPERATION: Wound VAC change in dressing change.   ANESTHESIA: General.   SURGEON: Quentin Orealph L. Ely, MD   OPERATIVE PROCEDURE: With the patient in the supine position after induction of appropriate general anesthesia, the patient's various wounds were individually prepped, repacked and redressed. A wound VAC was placed on the patient's right upper arm. Restore dressing was used for the patient's right foot. These areas are improving dramatically. The patient was then awakened and returned to the recovery room in satisfactory condition.    ____________________________ Quentin Orealph L. Ely III, MD rle:dp D: 05/26/2013 10:31:24 ET T: 05/26/2013 10:50:39 ET JOB#: [id number]  cc: Quentin Orealph L. Ely III, MD, <Dictator> Quentin OreALPH L ELY MD ELECTRONICALLY SIGNED 05/31/2013 16:19

## 2014-09-21 NOTE — Op Note (Signed)
PATIENT NAME:  Courtney Hebert, Courtney Hebert MR#:  [id number] DATE OF BIRTH:  07/18/1975  DATE OF PROCEDURE:  05/18/2013  PREOPERATIVE DIAGNOSES:   1.  Methicillin-resistant Staphylococcus aureus infection with multiple skin abscesses.  2.  Obesity.  3.  Poor venous access.   POSTOPERATIVE DIAGNOSES:   1.  Methicillin-resistant Staphylococcus aureus infection with multiple skin abscesses.  2.  Obesity.  3.  Poor venous access.   PROCEDURES:  1. Ultrasound guidance for vascular access to left basilic vein.  2. Fluoroscopic guidance for placement of catheter.  3. Insertion of peripherally inserted central venous catheter, triple lumen, left arm.  SURGEON: Annice NeedyJason S. Dew, MD  ANESTHESIA: Local.   ESTIMATED BLOOD LOSS: Minimal.   INDICATION FOR PROCEDURE: This is a 39 year old female with poor venous access admitted with MRSA infection. We are asked to place a PICC line.   DESCRIPTION OF PROCEDURE: The patient's left  arm was sterilely prepped and draped, and a sterile surgical field was created. The left basilic vein was accessed under direct ultrasound guidance without difficulty with a micropuncture needle and permanent image was recorded. 0.018 wire was then placed into the superior vena cava. Peel-away sheath was placed over the wire. A single lumen peripherally inserted central venous catheter was then placed over the wire and the wire and peel-away sheath were removed. The catheter tip was placed into the superior vena cava and was secured at the skin at 43 cm with a sterile dressing. The catheter withdrew blood well and flushed easily with heparinized saline. The patient tolerated procedure well.   ____________________________ Annice NeedyJason S. Dew, MD jsd:np D: 06/07/2013 14:03:20 ET T: 06/07/2013 20:18:27 ET JOB#: [id number]  cc: Annice NeedyJason S. Dew, MD, <Dictator> Annice NeedyJASON S DEW MD ELECTRONICALLY SIGNED 06/13/2013 8:54

## 2014-09-21 NOTE — Consult Note (Signed)
PATIENT NAME:  Courtney Hebert, Courtney Hebert MR#:  [id number] DATE OF BIRTH:  [birth year]  DATE OF CONSULTATION:  06/17/2013  REFERRING PHYSICIAN:  Dr. Benjie Karvonen  CONSULTING PHYSICIAN:  Arther Dames, MD  REASON FOR CONSULTATION:  Nausea, vomiting, abdominal pain.   HISTORY OF PRESENT ILLNESS: Ms. Courtney Hebert is a 39 year old female with a past medical history notable for DVT on Coumadin, end-stage renal disease, who is presenting to the hospital for evaluation of nausea, vomiting, abdominal pain. Ms. Courtney Hebert reports that she had these symptoms ever since she was recently in the hospital for the DVT and the skin infection. However, the symptoms have been gradually getting worse. Currently, now she is to the point where she feels as though she has severe nausea and vomiting even with trying to drink any liquids. She also does report some discomfort mostly in her left upper quadrant, but also all over her entire belly. She also is having some trouble with chest pain.   Of note, she did have issues with epistaxis, and in the setting of this, also did throw up a small amount of bright red blood. She has not had any melena, and has not thrown up any blood since the episode of epistaxis. She denies ever having had an upper endoscopy.   PAST MEDICAL HISTORY: 1.  DVT, on chronic Coumadin.  2.  Recurrent MRSA skin infections.  3.  End-stage renal disease.   SURGICAL HISTORY: C-section and left arm orthopedic surgery.   SOCIAL HISTORY: She is an ongoing smoker. She denies any alcohol or recreational drugs to me.   FAMILY HISTORY: She denies any history of GI malignancy, including gastric cancer or colon cancer.   HOME MEDICATIONS:  1.  Oxycodone 50 mg q. 6 hours.  2.  Coumadin 10 mg daily.  3.  Cephalexin 500 mg 3 times a day.  REVIEW OF SYSTEMS:   CONSTITUTIONAL: No weight gain or weight loss.  No fever or chills. HEENT: No oral lesions or sore throat. No vision changes. Positive for epistaxis. GASTROINTESTINAL: See  HPI.  HEME/LYMPH: No easy bruising or bleeding. CARDIOVASCULAR: No chest pain or dyspnea on exertion. GENITOURINARY: No hematuria. INTEGUMENTARY: No rashes or pruritus PSYCHIATRIC: No depression/anxiety.  ENDOCRINE: No heat/cold intolerance, no hair loss or skin changes. ALLERGIC/IMMUNOLOGIC: Negative for hives. RESPIRATORY: No cough, no shortness of breath.  MUSCULOSKELETAL: No joint swelling or muscle pain.  PHYSICAL EXAMINATION: VITAL SIGNS: Her temperature is 97.3, pulse is 48, respirations are 18, blood pressure 161/72, pulse ox is 95% on 2 liters.  GENERAL: An obese-appearing female. Alert and oriented times 4.  No acute distress. Appears stated age. HEENT: Normocephalic/atraumatic. Extraocular movements are intact. Anicteric. NECK: Soft, supple. JVP appears normal. No adenopathy. CHEST: Clear to auscultation. No wheeze or crackle. Respirations unlabored. HEART: Regular. No murmur, rub, or gallop.  Normal S1 and S2. ABDOMEN: Positive for diffuse tenderness to palpation. Soft, nondistended.  Normoactive bowel sounds in all 4 quadrants.  No organomegaly. No masses. Obese abdomen. Several small surgical scars. EXTREMITIES: No swelling, well perfused. SKIN: No rash or lesion. Skin color, texture, turgor normal. NEUROLOGICAL: Grossly intact. PSYCHIATRIC: Normal tone and affect. MUSCULOSKELETAL: No joint swelling or erythema.   LABORATORY DATA:  Her sodium is 136, potassium 3.4, creatinine 7.36, BUN 62. Her liver enzymes are notable for an ALT of 91, AST of 32, total bilirubin 0.7, alk phos 74. Her white count is 6.1, hemoglobin 10.7, hematocrit 33, platelet count 287, MCV is 80. INR currently is 1.4. Her pregnancy test is  negative.   ASSESSMENT AND PLAN: 1.  Nausea, vomiting, abdominal pain: I suspect these symptoms are likely due to chronic illness and medications. However, it would be warranted to perform an upper endoscopy to evaluate for peptic ulcer disease, esophagitis, gastritis,  or other causes of these symptoms.   In the meantime, I also do want to start her on Carafate and a GI cocktail to see if this does help some. I will plan to do the upper endoscopy on Tuesday afternoon.   2.  Hematemesis: This was in the setting of epistaxis. Her hemoglobin has not changed, and she has not had any melena. This is almost certainly related to the epistaxis. However, we were also performing the upper endoscopy for the other symptoms, and will evaluate for any source of bleeding at the same time. I do not see a need for a proton pump inhibitor drip at this time.   Further recommendations pending the findings on upper endoscopy, and also pending improvement with Carafate and a GI cocktail. If she does have dramatic improvement on these medicines, then I would consider canceling the upper endoscopy for Tuesday.   Thank you for this consult.    ____________________________ Arther Dames, MD mr:mr D: 06/17/2013 14:40:06 ET T: 06/17/2013 19:10:26 ET JOB#: [id number]  cc: Arther Dames, MD, <Dictator> Mellody Life MD ELECTRONICALLY SIGNED 06/19/2013 19:52

## 2014-09-21 NOTE — Consult Note (Signed)
Brief Consult Note: Diagnosis: bleeding rt arm wound.   Patient was seen by consultant.   Consult note dictated.   Recommend further assessment or treatment.   Comments: Called by visiting nurse with bleeding. sent to ED. No further bleeding. Minimal bleeding once dressing removed. xeroform dressing placed will see in office tomorrow and change dressing. pt appears groggy and asked for "morphine pills" to go with oxycodone. Asked if she could drive herself. Discussed with pt. and family. No further narcotics given.  Electronic Signatures: Lattie Hawooper, Richard E (MD)  (Signed 05-Jan-15 13:27)  Authored: Brief Consult Note   Last Updated: 05-Jan-15 13:27 by Lattie Hawooper, Richard E (MD)

## 2014-09-21 NOTE — Consult Note (Signed)
GI follow up:  S: somewhat better today.  tolerating PO but still some pain 5 minutes after eating. + N/v.  NO f/c, no bleeding  O: vss a and o x 4, nad cta, no w/c reg, no  m/r/g mild diffuse tendernes, soft, nabs, no r/g  A/P:   1.)PUD- - cont protonix 40 BID - cont carafate - follow h pylori serology - no nsaids - regular diet ok    Electronic Signatures: Dow Adolphein, Edith Lord (MD)  (Signed on 20-Jan-15 19:34)  Authored  Last Updated: 20-Jan-15 19:34 by Dow Adolphein, Bakary Bramer (MD)

## 2014-09-21 NOTE — Op Note (Signed)
PATIENT NAME:  [name], [name] MR#:  [id number] DATE OF BIRTH:  January 27, 1976  DATE OF PROCEDURE:  09/13/2013  ATTENDING PHYSICIAN: Dr. Ida Roguehristopher Koryn Charlot.  PREOPERATIVE DIAGNOSIS: History of methicillin-resistant Staphylococcus aureus abscesses who presents with multiple abscesses including left arm, left hand, right hand, right arm, right chest x 2, and right leg.   POSTOPERATIVE DIAGNOSIS: History of methicillin-resistant Staphylococcus aureus abscesses who presents with multiple abscesses including left arm, left hand, right hand, right arm, right chest x 2, and right leg.   PROCEDURE PERFORMED: Incision and drainage of left arm and hand abscesses.   ESTIMATED BLOOD LOSS: 5 mL.  COMPLICATIONS: Inability to complete I and D of all abscesses due to patient intolerance to procedure.   ANESTHESIA: IV MAC, inhaled nitrous oxide, and 1% lidocaine with epinephrine.   INDICATION FOR PROCEDURE: Ms. Courtney Hebert is a pleasant 39 year old female who presents with multiple skin abscesses and a history of multiple MRSA-positive skin abscesses. I had seen her in the office with incision and drainage of multiple skin abscesses.   DETAILS OF THE PROCEDURE: Are as follows: Informed consent was obtained. Ms. Courtney Hebert was brought to the operating room suite. She was laid supine on the operating room table. She attempted to have an IV placed and given IV sedation. However, the integrity of her IV was questionable. She then was given inhaled nitrous oxide. Her left arm and left hand was prepped and draped in the standard surgical fashion. A timeout was then performed correctly identifying the patient name, operative site, and procedure performed. An incision was made over her left arm wounds and this was deepened. There was a significant amount of purulence, and this wound was packed with iodoform gauze. I then focused my attention her left hand. This was approximately a 1 x 1-cm abscess which was incised; however, this became  quite painful to patient and this was not packed. Both wounds were irrigated and dressed. The procedure was aborted inability to tolerate further incision and drainage. There were no immediate complications. Needle, sponge, and instrument counts were correct at the end of the procedure.    ____________________________ Si Raiderhristopher A. Ceri Mayer, MD cal:lt D: 09/14/2013 19:16:43 ET T: 09/15/2013 02:34:21 ET JOB#: [id number]  cc: Cristal Deerhristopher A. Mariha Sleeper, MD, <Dictator> Jarvis NewcomerHRISTOPHER A Iwalani Templeton MD ELECTRONICALLY SIGNED 09/17/2013 1:50

## 2014-09-21 NOTE — Consult Note (Signed)
Details:   - GI Note:  EGD done today.  Findings:  -several small gastric ulcers along with gastritis.  -biopsies taken for h pylori  Recs:  - protonix 40 mg BID for 8 weeks, then 40 mg daily - carafate 1 gr TID dissolve in 1 tbsp water, for 2 weeks - avoid NSAIDS - treat h pylori if positive.   Electronic Signatures: Dow Adolphein, Matthew (MD)  (Signed 19-Jan-15 14:49)  Authored: Details   Last Updated: 19-Jan-15 14:49 by Dow Adolphein, Matthew (MD)

## 2014-09-21 NOTE — Discharge Summary (Signed)
PATIENT NAME:  Courtney Hebert, Courtney Hebert DATE OF BIRTH:  [birth year]  DATE OF ADMISSION:  05/29/2013 DATE OF DISCHARGE:  06/01/2013  ADMISSION DIAGNOSES: 1.  Fever.  2.  History of recent methicillin-resistant Staphylococcus aureus infection.   DISCHARGE DIAGNOSES: 1.  Sepsis on admission with fever, elevated white blood cell count likely from left arm cellulitis.  2.  Recent methicillin-resistant Staphylococcus aureus/Fusobacterium and cellulitis with wound VAC. 3.  Leukocytosis. 4.  Deep venous thrombosis on Coumadin.  5.  Arm cellulitis.  6.  Chronic pain syndrome.   CONSULTATIONS: Dr. Marshia Lyandy Ely.   Discharge INR is 2.8.   A CT of her left humerus showed subcutaneous edema and stranding in the left upper extremity extending along the superficial margin of the deltoid muscle laterally. There is a separate small collection of gas and fluid anterior to the left humeral head. Query recent injection.   Doppler of the upper extremities negative for DVT.   HOSPITAL COURSE: A 39 year old female who was just discharged from the hospital with a MRSA infection, had a wound VAC placed, came in with fever and sepsis. For further details, please refer to H and P.   1.  Sepsis. Initially it was thought that maybe the patient's methicillin-resistant Staphylococcus aureus infection was resistant to vancomycin, although her cultures from the past were positive for methicillin-resistant Staphylococcus aureus and sensitive to vancomycin. She was initially placed on linezolid. I changed her linezolid back to vancomycin, but she still had fevers. I think she actually had a left arm cellulitis as the etiology of her fever. She was also started on Zosyn during admission. I ordered a CT scan because at that injection site, there was cellulitis. I wanted to make sure there was not any air or any abscess, and I did consult Dr. Michela PitcherEly as outlined below. She has a PICC line that looked okay and the wound VAC was  changed actually on the 31st by surgery. She also had Fusobacterium on the previous wound culture, which is beta lactamase negative and she was on Zosyn.  2.  Methicillin-resistant Staphylococcus aureus/Fusobacterium cellulitis. This is a previous history as her last hospitalization. She was continued with wound VAC. This wound VAC was changed by surgery on 05/30/2013. She will continue with home health care. 3.  Leukocytosis, improved.  4.  Deep venous thrombosis. Her INR is 2.8. This is a recent diagnosis, but not during this hospitalization. 5.  Arm cellulitis after receiving Pneumovax vaccine.  I ordered CT as mentioned above. I did speak with Dr. Michela PitcherEly. He did not think there is any subcutaneous air. This is all likely secondary to the Pneumovax injection that she received. She was initially on Zosyn and will be transitioned to p.o. antibiotics. There is no drainable abscess.  6.  Chronic pain. The patient will follow up with her pain clinic next week. We did have to give her pain prescriptions for a week until her follow-up.   DISCHARGE MEDICATIONS: 1.  Coumadin 10 mg daily.  2.  Docusate 100 mg 2 tablets b.i.d. p.r.n. constipation.  3.  Nicotine patch 21 mg q. 24 hours. 4.  Vancomycin 1.25 q. 12 hours for 5 days.  5.  Morphine 15 mg q. 8 hours p.r.n. pain.  6.  Diflucan 150 mg, once.  7.  Keflex 700 mg t.i.d. x 8 days.  8.  Xanax 1 mg t.i.d. p.r.n. anxiety.  9.  Oxycodone 15 mg q. 6 hours p.r.n. pain.  DISCHARGE:  Home health  with nurse. The patient has antibiotic wound VAC and PICC line care.   DISCHARGE DIET: Regular diet.   DISCHARGE ACTIVITY: As tolerated.  DISCHARGE FOLLOWUP:  The patient will follow up with Dr. Michela Pitcher next week.    TIME SPENT: Approximately 35 minutes.  The patient is medically stable for discharge.    ____________________________ [name], MD spm:ce D: 06/01/2013 15:42:25 ET T: 06/01/2013 16:35:38 ET JOB#: [id number]  cc: Markeis Allman P. Juliene Pina, MD,  <Dictator> Carmie End, MD Janyth Contes Wolfgang Finigan MD ELECTRONICALLY SIGNED 06/01/2013 21:56

## 2014-09-21 NOTE — H&P (Signed)
PATIENT NAME:  Courtney BambergerFOSTER, Courtney Hebert#:  [id number] DATE OF BIRTH:  1976-05-21  DATE OF ADMISSION:  06/16/2013  PRIMARY CARE PHYSICIAN:  None.  REFERRING PHYSICIAN: Dr. Ladona Ridgelaylor  CHIEF COMPLAINT:  Nausea and vomiting for one week to 10 days.   HISTORY OF PRESENT ILLNESS: A 39 year old Caucasian female with a history of DVT, on chronic anticoagulation with Coumadin, recurrent skin infection with MRSA. Presented to the ED with above chief complaint. The patient is alert, awake, oriented, in no acute distress. The patient was just discharged from our hospital 2 weeks ago after treatment of MRSA infection with cellulitis. The patient was discharged with IV vancomycin for 5 days, then changed to p.o. Zyvox. However, patient developed nausea, vomiting multiple times a day for the past 7 to 10 days. The patient was not able to take any p.o. Zyvox or other medication. She had dizziness and weakness, and less urine, only once a day. In addition, patient complains of epistaxis several times, and vomiting fresh blood 3 to 4 times, about half cup total. The patient also complains of fever, 102 yesterday, so patient comes to ED for further evaluation. The patient was noticed to have an elevated BUN of 75, creatinine 8.57, and is being admitted for acute renal failure. The patient denies any chest pain, palpitation. No melena or bloody stool. No diarrhea. Denies any hematuria or easy bruising.   PAST MEDICAL HISTORY:   1.  DVT, on chronic anticoagulation with Coumadin.  2.  Recurrent skin infection with MRSA.   SURGICAL HISTORY:  1.  C-section.  2.  Left arm orthopedic surgery.   SOCIAL HISTORY: The patient is a smoker, 1/2 pack to 1 pack a day, but decreased to 2 cigarettes a day for the past 2 weeks. Denies any alcohol abuse or drug abuse.   FAMILY HISTORY:  CVA.    ALLERGIES:  None.   HOME MEDICATIONS:  1.  Oxycodone 15 mg p.o. every 6 hours p.r.n.  2.  Coumadin 10 mg p.o. daily.  3.  Cephalexin 500 mg  p.o. capsule 3 times a day for 8 days.   REVIEW OF SYSTEMS:   CONSTITUTIONAL: The patient has fever, chills, headache, dizziness, weakness.  EYES: No double vision or blurred vision.  ENT: Positive for epistaxis, but no postnasal drip, slurred speech, or dysphagia.  CARDIOVASCULAR: No chest pain, palpitation, orthopnea, or nocturnal dyspnea. No leg edema.  PULMONARY: No cough, sputum, shortness of breath, or hemoptysis.  GASTROINTESTINAL: No abdominal pain, but has nausea, vomiting and hematemesis, but no melena or bloody stool. No diarrhea.  GENITOURINARY: No dysuria, hematuria, or incontinence, but has oliguria.   SKIN: No rash or jaundice.  NEUROLOGIC: No syncope, loss of consciousness, or seizure.  HEMATOLOGIC: Has hematemesis and epistaxis, but no easy bruising.  ENDOCRINE: No polyuria, polydipsia, heat or cold intolerance.   VITAL SIGNS: Temperature 97.4, blood pressure 141/83, pulse 50, respirations 18, O2 saturation 99% on room air.   PHYSICAL EXAMINATION: GENERAL: The patient is alert, awake, oriented, in no acute distress.  HEENT: Pupils round, equal, react to light and accommodation. NECK: Supple. No JVD or carotid bruit. No lymphadenopathy. No thyromegaly.  CARDIOVASCULAR: S1, S2. Regular rate and rhythm. No murmurs or gallops.  PULMONARY:  Bilateral air entry.  No wheezing or rales.  No use of accessory muscle to breathe.  ABDOMEN: Obese, soft. Diffuse tenderness. No rigidity. No rebound. No obvious organomegaly. Bowel sounds present.  EXTREMITIES: No edema, clubbing, or cyanosis. No calf tenderness, but has  a healing ulcer on the upper part of the right arm and right ankle dressing.  SKIN: No rash or jaundice.  NEUROLOGY: Alert and oriented x 3. No focal deficit. Power 5/5. Sensation intact.   LABORATORY DATA: WBC 5.7, hemoglobin 11.2, platelets 295. Glucose 105, BUN 75, creatinine 8.57, sodium 134, potassium 3.3, chloride 98, bicarb 26. Urinalysis is negative.  INR  2.8.  IMPRESSIONS:  1.  Acute renal failure, possibly due to dehydration or side effect of vancomycin.  2.  Hyponatremia.  3.  Hypokalemia.  4.  Gastrointestinal bleeding.  5.  Epistaxis.  6.  Anemia.  7.  Coagulopathy.  8.  Healing MRSA infection on the right arm and ankle.  9.  Obesity.   PLAN OF TREATMENT: 1.  The patient will be admitted to medical floor. Will continue normal saline IV and follow up BMP, kidney ultrasound. We will get a Nephrology consult.   2.  Coagulopathy and GI bleeding. We will start Protonix IV, hold Coumadin. Give vitamin K one dose, 10 mg. Follow up INR, follow up hemoglobin. We will get a GI consult.   3.  MRSA infection. We will continue Zyvox IV and get wound care.   I discussed the patient's condition and plan of treatment with patient and patient's fiance.   TIME SPENT: About 67 minutes.    ____________________________ Shaune Pollack, MD qc:Hebert D: 06/16/2013 18:43:26 ET T: 06/16/2013 19:13:33 ET JOB#: [id number]  cc: Shaune Pollack, MD, <Dictator> Shaune Pollack MD ELECTRONICALLY SIGNED 06/17/2013 10:51

## 2014-09-21 NOTE — Discharge Summary (Signed)
PATIENT NAME:  Courtney Hebert, Courtney Hebert MR#:  [id number] DATE OF BIRTH:  12-Aug-1975  DATE OF ADMISSION:  05/18/2013 DATE OF DISCHARGE:  05/28/2013  DISCHARGE DIAGNOSES:  1.  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  2.  Deep vein thrombosis in the past and recurred in this admission.  3.  Chronic pain syndrome.  4.  Anxiety.  5.  Smoking.   CONDITION ON DISCHARGE: Stable.   CODE STATUS: FULL CODE.   MEDICATIONS ON DISCHARGE:  1.  Oxycodone 15 mg oral tablet every 6 hours as needed for pain.  2.  Coumadin 10 mg oral tablet once a day.  3.  Xanax 1 mg oral tablet 3 times a day for 7 days as needed.  4.  Nystatin 100,000 units/g topical powder to affected area 2 times a day for 10 days.  5.  Docusate sodium 100 mg oral capsule 2 times a day for 15 days as needed for constipation.  6.  Nicotine patch transdermal once a day.  7.  Vancomycin 1.25 g IV every 12 hours for 5 days.  8.  Morphine 15 mg oral tablet every 8 hours for 3 days as needed for pain.   Advised to have visiting nurse services at home for IV vancomycin therapy and wound assessment and PICC line care and also advised to follow with Dr. Marlowe Kays office in 3 to 4 days for assessment of the wound and pain management clinic for chronic pain, DVT, and anticoagulation issues.   HISTORY OF PRESENT ILLNESS: The patient is a 39 year old female with a history of MRSA, recurrent abscess, skin infections, and history of DVT presented with multiple abscesses for the past few days. Reported that started to have an abscess on the left upper extremity, right lower extremity, and, to a lesser extent, on the left lower extremity for the past few days. Reported that she has known history of MRSA skin infection and drained in the past. Leukocytosis was 14.8 on the presentation. She had I and D done in the right arm and right ankle by ER physician,  and hospitalist service was contacted for need of  IV antibiotic because of MRSA history and chronic pain.   HOSPITAL COURSE AND STAY: This patient was admitted for IV antibiotic therapy. She was established a PICC line and started on vancomycin therapy. Surgery consult was called in and they did I and D and placed wound VACs and followed and changed the dressing regularly.    HOSPITAL COURSE: The patient remained afebrile for most a week. As she was not having any insurance, we had to arrange for home visiting nurse and antibiotics as outpatient and, after 10 days in hospital, we were successful in arranging that, so discharged to home with 5 more days of IV vancomycin therapy to finish a 15-day course.  The patient remained afebrile and pain was fairly controlled with medication.   Cultures from the wound grew MRSA.   OTHER MEDICAL ISSUES IN THE HOSPITAL:  1.  History of recurrent DVT. She was taking Coumadin at home, but INR was subtherapeutic, and during the hospitalization, she started having her right leg swelling and severe pain, and we did a DVT study and found having a DVT, so started on Lovenox for bridging. INR level came up to 2 and so we discharged her home with Coumadin to be continued and follow up by visiting nurse and with PMD for Coumadin levels.  2.  Anxiety. We continued Xanax.  3.  Tobacco abuse. We continued nicotine patch in the hospital.  4.  Chronic pain syndrome. There was a hard time to control her pain symptoms because of her high tolerance and chronic use of narcotics, but finally we discharged her home with oral pain medication.   CONSULTS IN THE HOSPITAL: Dr. Michela PitcherEly and Dr. Juliann PulseLundquist for surgery for wounds.   IMPORTANT LABORATORY RESULTS IN THE HOSPITAL: INR was 1.2 on presentation. Creatinine was 0.86. Potassium was 3.3. WBC 14.8, hemoglobin 13.4. Blood cultures were negative. Wound culture grew MRSA.   Ultrasound color Doppler, lower extremity, on the 28th of December: Acute deep vein thrombosis extending into  posterior tibial vein.   On the day of discharge on the 29th of December, INR was 2. Hemoglobin was 11.2.   TOTAL TIME SPENT ON THIS DISCHARGE: 40 minutes.   ____________________________ Hope PigeonVaibhavkumar G. Elisabeth PigeonVachhani, MD vgv:np D: 05/31/2013 18:09:00 ET T: 05/31/2013 18:53:26 ET JOB#: [id number]  cc: Hope PigeonVaibhavkumar G. Elisabeth PigeonVachhani, MD, <Dictator> Carmie Endalph L. Ely III, MD Altamese DillingVAIBHAVKUMAR Ryley Teater MD ELECTRONICALLY SIGNED 06/03/2013 22:05

## 2014-09-21 NOTE — Discharge Summary (Signed)
PATIENT NAME:  Courtney Hebert, Cannon V MR#:  [id number] DATE OF BIRTH:  [birth year]  DATE OF ADMISSION:  06/16/2013 DATE OF DISCHARGE:  06/22/2013  DISCHARGE DIAGNOSES: 1.  Acute renal failure secondary to acute tubular necrosis.  2.  Multiple nonbleeding gastric ulcers.  3.  Acute pancreatitis.  4.  Acute blood loss anemia.  5.  History of deep vein thrombosis on Coumadin.  6.  Chronic pain syndrome.  7.  History of methicillin-resistant Staphylococcus aureus abscess.  8.  Tobacco abuse.   IMAGING STUDIES:   1.  Include a KUB x-ray which showed no acute abnormalities.  2.  Ultrasound of the kidneys bilaterally showed no hydronephrosis, solid mass.  Normal renal ultrasound.  3.  Ultrasound of the abdomen showed no gallstones.  Cholecystitis.   CONSULTANTS:  1.  Dr. Shelle Ironein of GI.  2.  Dr. Cherylann RatelLateef with nephrology.   ADMITTING HISTORY, PHYSICAL AND HOSPITAL COURSE:  Please see detailed H and P dictated by Dr. Imogene Burnhen previously.  In brief, a 39 year old Caucasian female patient with prior history of MRSA abscesses who was on IV vancomycin previously, presented to the hospital complaining of nausea, vomiting for one week.  The patient was found to be in acute renal failure and admitted to the hospitalist service.  The patient also had some hematemesis at the time of admission.  1.  Acute renal failure.  This was thought to be secondary to ATN, dehydration from nausea, vomiting and also being on vancomycin.  The patient did receive IV contrast previously for an imaging study.  Nephrology has followed the patient during the hospital stay.  Ultrasound of the kidneys has been normal.  The patient was started on IV fluids which her creatinine improved significantly from 8 to 3.7.  Presently, her creatinine is trending down, but much slower.  I feel like she might stabilize her creatinine higher than what she was at in the past and will follow up with nephrology, Dr. Cherylann RatelLateef as outpatient.  2.  Hematemesis.  The  patient did have multiple gastric ulcers on examination.  These were nonbleeding.  No blood found.  Her Coumadin was held during the hospital stay, but with her history of recent deep vein thrombosis within the past two months, I will resume her Coumadin at discharge.  I have asked her to watch out for any bleeding and follow up with Dr. Shelle Ironein.  She will be on Protonix, Carafate and will return to the Emergency Room if she notices any bleeding.  3.  Acute pancreatitis.  The patient did have elevation in lipase up to 900, which has trended down.  Her pain is improved.  Ultrasound showed no gallstones.  Her triglycerides are elevated, but only mildly at 253 and she does not drink significant alcohol.  It is of unknown etiology at this time.  Her lipase could have also been elevated secondary to her renal failure, but this is improved.  4.  MRSA abscesses.  The patient did not have any problems, was not on any antibiotics.  Afebrile, normal white count.  5.  Chronic pain syndrome.  The patient is on chronic high dose pain medications.  I have given her a prescription for one more week as she has follow-up appointment with Dr. Peggye Leyollie at the pain center on 06/29/2013.   Today on examination, the patient has mild tenderness in the epigastric area.  Lungs sound clear.   DISCHARGE MEDICATIONS:  Include:  1.  Coumadin 10 mg oral once a day.  2.  Oxycodone 15 mg oral every six hours as needed for pain.  3.  Protonix 40 mg oral 2 times a day.  4.  Carafate 1 gram 4 times a day.  5.  Promethazine 25 mg oral every six hours as needed for nausea, vomiting.   DISCHARGE INSTRUCTIONS:  The patient will be on regular food of regular consistency.  Activity as tolerated.  Follow up with Dr. Cherylann Ratel and Dr. Shelle Iron in 1 to 2 weeks.  Follow up with Dr. Peggye Ley of pain clinic in a week.  Watch out for any bleeding and return to the Emergency Room if this happens.   Time spent on day of discharge in discharge activity was 38  minutes.    ____________________________ Molinda Bailiff Arron Mcnaught, MD srs:ea D: 06/23/2013 17:12:55 ET T: 06/23/2013 23:09:29 ET JOB#: [id number]  cc: Wardell Heath R. Elpidio Anis, MD, <Dictator> Dow Adolph, MD Dr. Peggye Ley, [facility] [place] Lizabeth Leyden, MD  Orie Fisherman MD ELECTRONICALLY SIGNED 06/26/2013 10:19

## 2014-09-21 NOTE — H&P (Signed)
PATIENT NAME:  [name], Teigen V MR#:  [id number] DATE OF BIRTH:  04-25-1976  DATE OF ADMISSION:  05/29/2013  PRIMARY CARE PHYSICIAN: None.   REFERRING EMERGENCY ROOM PHYSICIAN: Dr. Mindi JunkerGottlieb.   CHIEF COMPLAINT: Fever.   HISTORY OF PRESENTING ILLNESS: A 39 year old female with history of recurrent DVTs and MRSA skin infections who was in the hospital from December 19th till December 29th, discharged yesterday but admission was for multiple skin infections and cellulitis. We consulted surgery and they did I and D with wound VAC and the patient's wound was recovering nicely. She was on IV vancomycin for 10 days in hospital and we arranged for PICC line. She remained afebrile so finally discharged yesterday with arrangement of home antibiotics. Today, she had fever at home and brought back to the hospital. As per the boyfriend, her right foot wound is getting red since yesterday evening and now it started oozing some secretions also.  REVIEW OF SYSTEMS: CONSTITUTIONAL: Positive for fever, fatigue and generalized pain.  EYES: No blurring, double vision, discharge or redness.  EARS, NOSE, THROAT: No tinnitus, ear pain or hearing loss.  RESPIRATORY: No cough, wheezing, hemoptysis or shortness of breath.  CARDIOVASCULAR: No chest pain, orthopnea, edema or palpitations.  GASTROINTESTINAL: No nausea, vomiting, diarrhea or abdominal pain.  GENITOURINARY: No dysuria, hematuria or increased frequency.  ENDOCRINE: No increased sweating. No heat or cold intolerance.  SKIN: No acne or rashes but has multiple crusted and dried lesions on the skin on right thigh, on the left shoulder and there is a wound VAC present on the right arm and on the right foot the 2 cm x 2 cm wound is there with some yellowish secretion and foot is swollen and red with warmth to palpate.  MUSCULOSKELETAL: No swelling or pain in the joints.  NEUROLOGICAL: No numbness, weakness, tremor or vertigo.  PSYCHIATRIC: No anxiety, insomnia,  bipolar disorder.   PAST MEDICAL HISTORY:  1.  DVT and chronic anticoagulation with warfarin.  2.  Recurrent skin infections with MRSA and diagnosed and treated for recent and discharged yesterday.   PAST SURGICAL HISTORY:  1.  History of C-section.  2.  Left arm orthopedic surgery.   FAMILY HISTORY: Significant for cerebrovascular accident.   SOCIAL HISTORY: She is a smoker, 1/2 to 1 pack per day. Denies any illicit drug use and any alcohol, and currently unemployed.   HOME MEDICATIONS: At discharged yesterday, she was discharged on:  1.  Oxycodone 50 mg oral tablet every 6 hours as needed for pain.  2.  Coumadin 10 mg oral once a day.  3.  Xanax 1 mg oral tablet 3 times a day for 7 days.  4.  Nystatin powder to groin area 2 times a day for 10 days.  5.  Docusate sodium 100 mg capsule 2 times a day for 15 days.  6.  Nicotine patch once a day.  7.  Vancomycin 1.25 grams injection IV through PICC line every 12 hours for 5 days.  8.  Morphine 15 mg oral tablet every 8 hours for 3 days as needed.   PHYSICAL EXAMINATION: VITAL SIGNS: In the ER, temperature 103, pulse rate 105, respirations 24, blood pressure 142/87, oxygen 94% on room air.  GENERAL: The patient is fully alert, appears slightly distressed but oriented to time, place and person.  HEENT: Head and neck atraumatic. Conjunctivae pink. Oral mucosa moist.  NECK: Supple. No JVD.  RESPIRATORY: Bilateral clear and equal air entry.  CARDIOVASCULAR: S1, S2 present, regular. No  murmur.  ABDOMEN: Soft, nontender. Bowel sounds present. No organomegaly.  SKIN: There are multiple crusted dried lesions on right thigh and left shoulder and left foot, but on right foot there is a 1.5 cm x 1.5 cm open lesion which is oozing some secretions and surrounding foot is red and hot and there is wound VAC present on the right arm with a PICC line.   LABORATORY, DIAGNOSTIC AND RADIOLOGICAL DATA:  BUN is 5, creatinine 1, sodium is 128, potassium is  4.0, chloride 98 and CO2 is 26, phosphorus is 1.8. Total white cell count is 12.7, hemoglobin 11.8. INR is 2.5. Influenza negative. Urinalysis is negative. Chest x-ray is without any significant finding.   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  1.  Sepsis secondary to cellulitis, most likely resistance to vancomycin as she was on therapeutic vancomycin for the last 12 days and now had worsening in the lesions. Will put her on linezolid and Zosyn for now and will send blood culture to find out about this resistance. I will also call surgery consult for, if needed, incision and drainage and cleanup of the wound and then they might also send a culture from the wound to find about the bacteria for resistance.  Will give Tylenol for fever over here.  2.  Deep vein thrombosis. INR is 2.5, therapeutic, so we will continue Coumadin and we will check it in the next few days.  3.  Anxiety. Will continue Xanax.  4.  Chronic pain syndrome, on chronic pain medications. We will continue oxycodone here and give morphine because of her worsening pain due to cellulitis.  5.  Hypophosphatemia and hyponatremia. This might be due to her sepsis. Will give normal saline for hyponatremia and phosphorus replacement orally.  6.  Smoker. Smoking cessation counseling done for 4 minutes and she agreed to continue using nicotine patch over here.  7.  CODE STATUS: Full code.   TOTAL TIME SPENT ON THIS ADMISSION: 50 minutes.     ____________________________ Hope Pigeon Elisabeth Pigeon, MD vgv:cs D: 05/29/2013 16:42:38  ET T: 05/29/2013 18:28:38 ET JOB#: [id number]  cc: Hope Pigeon. Elisabeth Pigeon, MD, <Dictator> Altamese Dilling MD ELECTRONICALLY SIGNED 06/06/2013 13:50

## 2015-05-20 ENCOUNTER — Emergency Department
Admission: EM | Admit: 2015-05-20 | Discharge: 2015-05-20 | Disposition: A | Discharge status: Home / Self Care | Payer: Self-pay | Attending: Emergency Medicine | Admitting: Emergency Medicine

## 2015-05-20 ENCOUNTER — Encounter: Payer: Self-pay | Admitting: Emergency Medicine

## 2015-05-20 DIAGNOSIS — H9209 Otalgia, unspecified ear: Secondary | ICD-10-CM | POA: Insufficient documentation

## 2015-05-20 DIAGNOSIS — K047 Periapical abscess without sinus: Secondary | ICD-10-CM | POA: Insufficient documentation

## 2015-05-20 DIAGNOSIS — K029 Dental caries, unspecified: Secondary | ICD-10-CM | POA: Insufficient documentation

## 2015-05-20 DIAGNOSIS — F17200 Nicotine dependence, unspecified, uncomplicated: Secondary | ICD-10-CM | POA: Insufficient documentation

## 2015-05-20 HISTORY — DX: Other chronic pain: G89.29

## 2015-05-20 HISTORY — DX: Dorsalgia, unspecified: M54.9

## 2015-05-20 MED ORDER — AMOXICILLIN 500 MG PO TABS: 500.0000 mg | ORAL_TABLET | Freq: Three times a day (TID) | ORAL | Status: AC

## 2015-05-20 MED ORDER — LIDOCAINE VISCOUS 2 % MT SOLN: 20.0000 mL | OROMUCOSAL | Status: DC | PRN

## 2015-05-20 NOTE — ED Notes (Signed)
Pt to ed with c/o swelling to right side of face.  Pt reports started with dental pain yesterday.

## 2015-05-20 NOTE — Discharge Instructions (Signed)

## 2015-05-20 NOTE — ED Provider Notes (Signed)
CSN: [id number]     Arrival date & time 05/20/15  [id number] History   First MD Initiated Contact with Patient 05/20/15 0932     Chief Complaint  Patient presents with  . Dental Pain      HPI Comments: 39 year old female presents today complaining of pain and swelling to the right side of her face. She is on methadone for chronic pain and this has not helped. Some subjective fevers and is now having pain into her right sinus and ear.   The history is provided by the patient.    Past Medical History  Diagnosis Date  . Chronic back pain    History reviewed. No pertinent past surgical history. History reviewed. No pertinent family history. Social History  Substance Use Topics  . Smoking status: Current Every Day Smoker  . Smokeless tobacco: None  . Alcohol Use: Yes   OB History    Gravida Para Term Preterm AB TAB SAB Ectopic Multiple Living   4              Review of Systems  Constitutional: Positive for fever. Negative for chills.  HENT: Positive for dental problem, ear pain and facial swelling. Negative for congestion.   Skin: Negative for rash.  All other systems reviewed and are negative.     Allergies  Vancomycin  Home Medications   Prior to Admission medications   Medication Sig Start Date End Date Taking? Authorizing Provider  amoxicillin (AMOXIL) 500 MG tablet Take 1 tablet (500 mg total) by mouth 3 (three) times daily. 05/20/15 05/27/15  Christella ScheuermannEmma Rakan Soffer V, PA-C  lidocaine (XYLOCAINE) 2 % solution Use as directed 20 mLs in the mouth or throat as needed for mouth pain. 05/20/15   Christella ScheuermannEmma Brody Kump V, PA-C   Pulse 76  Temp(Src) 98.2 F (36.8 C) (Oral)  Resp 20  Ht 5\' 7"  (1.702 m)  Wt 113.399 kg  BMI 39.15 kg/m2  SpO2 95%  LMP 04/29/2015 Physical Exam  Constitutional: She is oriented to person, place, and time. Vital signs are normal. She appears well-developed and well-nourished. She is active.  Non-toxic appearance. She does not have a sickly appearance. She does  not appear ill.  HENT:  Head: Normocephalic and atraumatic.  Right Ear: Tympanic membrane and external ear normal.  Left Ear: Tympanic membrane and external ear normal.  Nose: Nose normal.  Mouth/Throat: Uvula is midline, oropharynx is clear and moist and mucous membranes are normal. Abnormal dentition. Dental abscesses and dental caries present.    Eyes: Conjunctivae and EOM are normal. Pupils are equal, round, and reactive to light.  Neck: Normal range of motion. Neck supple.  Lymphadenopathy:    She has no cervical adenopathy.  Neurological: She is alert and oriented to person, place, and time.  Skin: Skin is warm and dry.  Psychiatric: She has a normal mood and affect. Her behavior is normal. Judgment and thought content normal.  Nursing note and vitals reviewed.   ED Course  Procedures (including critical care time) Labs Review Labs Reviewed - No data to display  Imaging Review No results found. I have personally reviewed and evaluated these images and lab results as part of my medical decision-making.   EKG Interpretation None      MDM  Viscous lidocaine to numb gums Amox 7 day course for infection Needs to see dentist asap, given handout on local dental clinics that are sliding scale.  Follow up with us for new or worsening symptoms prior to  dental evaluation  Final diagnoses:  Dental abscess       Christella Scheuermann, PA-C 05/20/15 0946  Christella Scheuermann, PA-C 05/20/15 1610  Emily Filbert, MD 05/20/15 [phone]

## 2015-10-03 ENCOUNTER — Emergency Department: Payer: Self-pay

## 2015-10-03 ENCOUNTER — Inpatient Hospital Stay
Admission: EM | Admit: 2015-10-03 | Discharge: 2015-10-08 | DRG: 917 | Disposition: A | Discharge status: Home / Self Care | Payer: Self-pay | Attending: Specialist | Admitting: Specialist

## 2015-10-03 ENCOUNTER — Encounter: Payer: Self-pay | Admitting: Emergency Medicine

## 2015-10-03 ENCOUNTER — Emergency Department: Payer: MEDICAID

## 2015-10-03 DIAGNOSIS — Z452 Encounter for adjustment and management of vascular access device: Secondary | ICD-10-CM

## 2015-10-03 DIAGNOSIS — Z4659 Encounter for fitting and adjustment of other gastrointestinal appliance and device: Secondary | ICD-10-CM

## 2015-10-03 DIAGNOSIS — Z6841 Body Mass Index (BMI) 40.0 and over, adult: Secondary | ICD-10-CM

## 2015-10-03 DIAGNOSIS — J9601 Acute respiratory failure with hypoxia: Secondary | ICD-10-CM

## 2015-10-03 DIAGNOSIS — G894 Chronic pain syndrome: Secondary | ICD-10-CM | POA: Diagnosis present

## 2015-10-03 DIAGNOSIS — Z86718 Personal history of other venous thrombosis and embolism: Secondary | ICD-10-CM

## 2015-10-03 DIAGNOSIS — E876 Hypokalemia: Secondary | ICD-10-CM | POA: Diagnosis not present

## 2015-10-03 DIAGNOSIS — F17200 Nicotine dependence, unspecified, uncomplicated: Secondary | ICD-10-CM

## 2015-10-03 DIAGNOSIS — R609 Edema, unspecified: Secondary | ICD-10-CM

## 2015-10-03 DIAGNOSIS — F1120 Opioid dependence, uncomplicated: Secondary | ICD-10-CM

## 2015-10-03 DIAGNOSIS — G43909 Migraine, unspecified, not intractable, without status migrainosus: Secondary | ICD-10-CM

## 2015-10-03 DIAGNOSIS — F329 Major depressive disorder, single episode, unspecified: Secondary | ICD-10-CM | POA: Diagnosis present

## 2015-10-03 DIAGNOSIS — E669 Obesity, unspecified: Secondary | ICD-10-CM | POA: Diagnosis present

## 2015-10-03 DIAGNOSIS — Z7901 Long term (current) use of anticoagulants: Secondary | ICD-10-CM

## 2015-10-03 DIAGNOSIS — G9340 Encephalopathy, unspecified: Secondary | ICD-10-CM | POA: Diagnosis present

## 2015-10-03 DIAGNOSIS — F3132 Bipolar disorder, current episode depressed, moderate: Secondary | ICD-10-CM | POA: Insufficient documentation

## 2015-10-03 DIAGNOSIS — T403X4A Poisoning by methadone, undetermined, initial encounter: Secondary | ICD-10-CM

## 2015-10-03 DIAGNOSIS — F319 Bipolar disorder, unspecified: Secondary | ICD-10-CM

## 2015-10-03 DIAGNOSIS — Z01818 Encounter for other preprocedural examination: Secondary | ICD-10-CM

## 2015-10-03 DIAGNOSIS — R4189 Other symptoms and signs involving cognitive functions and awareness: Secondary | ICD-10-CM

## 2015-10-03 DIAGNOSIS — T401X1A Poisoning by heroin, accidental (unintentional), initial encounter: Principal | ICD-10-CM | POA: Diagnosis present

## 2015-10-03 DIAGNOSIS — Z8614 Personal history of Methicillin resistant Staphylococcus aureus infection: Secondary | ICD-10-CM

## 2015-10-03 DIAGNOSIS — F1121 Opioid dependence, in remission: Secondary | ICD-10-CM | POA: Diagnosis present

## 2015-10-03 DIAGNOSIS — T403X1A Poisoning by methadone, accidental (unintentional), initial encounter: Secondary | ICD-10-CM | POA: Diagnosis present

## 2015-10-03 LAB — COMPREHENSIVE METABOLIC PANEL
ALK PHOS: 80 U/L (ref 38–126)
ALT: 22 U/L (ref 14–54)
ANION GAP: 9 (ref 5–15)
AST: 29 U/L (ref 15–41)
Albumin: 3.7 g/dL (ref 3.5–5.0)
BILIRUBIN TOTAL: 0.7 mg/dL (ref 0.3–1.2)
BUN: 11 mg/dL (ref 6–20)
CALCIUM: 8.7 mg/dL — AB (ref 8.9–10.3)
CO2: 29 mmol/L (ref 22–32)
CREATININE: 0.83 mg/dL (ref 0.44–1.00)
Chloride: 98 mmol/L — ABNORMAL LOW (ref 101–111)
Glucose, Bld: 136 mg/dL — ABNORMAL HIGH (ref 65–99)
Potassium: 3.9 mmol/L (ref 3.5–5.1)
SODIUM: 136 mmol/L (ref 135–145)
TOTAL PROTEIN: 8 g/dL (ref 6.5–8.1)

## 2015-10-03 LAB — URINALYSIS COMPLETE WITH MICROSCOPIC (ARMC ONLY)
BILIRUBIN URINE: NEGATIVE
Bacteria, UA: NONE SEEN
Glucose, UA: NEGATIVE mg/dL
Hgb urine dipstick: NEGATIVE
KETONES UR: NEGATIVE mg/dL
Nitrite: NEGATIVE
PH: 6 (ref 5.0–8.0)
PROTEIN: NEGATIVE mg/dL
SPECIFIC GRAVITY, URINE: 1.01 (ref 1.005–1.030)

## 2015-10-03 LAB — CBC WITH DIFFERENTIAL/PLATELET
BASOS ABS: 0 10*3/uL (ref 0–0.1)
Basophils Relative: 0 %
EOS ABS: 0.1 10*3/uL (ref 0–0.7)
HCT: 44 % (ref 35.0–47.0)
Hemoglobin: 14.2 g/dL (ref 12.0–16.0)
Lymphocytes Relative: 15 %
Lymphs Abs: 2 10*3/uL (ref 1.0–3.6)
MCH: 28.4 pg (ref 26.0–34.0)
MCHC: 32.3 g/dL (ref 32.0–36.0)
MCV: 87.8 fL (ref 80.0–100.0)
MONO ABS: 0.6 10*3/uL (ref 0.2–0.9)
Monocytes Relative: 4 %
Neutro Abs: 10.8 10*3/uL — ABNORMAL HIGH (ref 1.4–6.5)
Neutrophils Relative %: 80 %
PLATELETS: 268 10*3/uL (ref 150–440)
RBC: 5.01 MIL/uL (ref 3.80–5.20)
RDW: 17.5 % — AB (ref 11.5–14.5)
WBC: 13.4 10*3/uL — AB (ref 3.6–11.0)

## 2015-10-03 LAB — URINE DRUG SCREEN, QUALITATIVE (ARMC ONLY)
AMPHETAMINES, UR SCREEN: NOT DETECTED
Barbiturates, Ur Screen: NOT DETECTED
Benzodiazepine, Ur Scrn: POSITIVE — AB
CANNABINOID 50 NG, UR ~~LOC~~: NOT DETECTED
Cocaine Metabolite,Ur ~~LOC~~: NOT DETECTED
MDMA (ECSTASY) UR SCREEN: NOT DETECTED
Methadone Scn, Ur: POSITIVE — AB
OPIATE, UR SCREEN: NOT DETECTED
PHENCYCLIDINE (PCP) UR S: NOT DETECTED
Tricyclic, Ur Screen: NOT DETECTED

## 2015-10-03 LAB — BLOOD GAS, ARTERIAL
Acid-Base Excess: 9.5 mmol/L — ABNORMAL HIGH (ref 0.0–3.0)
Bicarbonate: 32.2 mEq/L — ABNORMAL HIGH (ref 21.0–28.0)
FIO2: 1
MECHVT: 500 mL
O2 Saturation: 99.6 %
PATIENT TEMPERATURE: 37
PEEP/CPAP: 5 cmH2O
PO2 ART: 155 mmHg — AB (ref 83.0–108.0)
RATE: 16 resp/min
pCO2 arterial: 36 mmHg (ref 32.0–48.0)
pH, Arterial: 7.56 — ABNORMAL HIGH (ref 7.350–7.450)

## 2015-10-03 LAB — PREGNANCY, URINE: PREG TEST UR: NEGATIVE

## 2015-10-03 LAB — TROPONIN I

## 2015-10-03 LAB — ACETAMINOPHEN LEVEL: Acetaminophen (Tylenol), Serum: <10 ug/mL — ABNORMAL LOW (ref 10–30)

## 2015-10-03 LAB — ETHANOL

## 2015-10-03 LAB — SALICYLATE LEVEL: Salicylate Lvl: <4 mg/dL (ref 2.8–30.0)

## 2015-10-03 MED ORDER — HEPARIN SODIUM (PORCINE) 5000 UNIT/ML IJ SOLN
5000.0000 [IU] | Freq: Three times a day (TID) | INTRAMUSCULAR | Status: DC
Start: 2015-10-03 — End: 2015-10-06
  Administered 2015-10-04 – 2015-10-06 (×7): 5000 [IU] via SUBCUTANEOUS
  Filled 2015-10-03 (×7): qty 1

## 2015-10-03 MED ORDER — PANTOPRAZOLE SODIUM 40 MG IV SOLR: 40.0000 mg | Freq: Every day | INTRAVENOUS | Status: DC

## 2015-10-03 MED ORDER — NALOXONE HCL 2 MG/2ML IJ SOSY
PREFILLED_SYRINGE | INTRAMUSCULAR | Status: AC
  Filled 2015-10-03: qty 2

## 2015-10-03 MED ORDER — SODIUM CHLORIDE 0.9 % IV SOLN
INTRAVENOUS | Status: AC | PRN
  Administered 2015-10-03: 1000 mL via INTRAVENOUS

## 2015-10-03 MED ORDER — FENTANYL 2500MCG IN NS 250ML (10MCG/ML) PREMIX INFUSION
INTRAVENOUS | Status: AC
  Administered 2015-10-03: 50 ug/h
  Filled 2015-10-03: qty 250

## 2015-10-03 MED ORDER — MIDAZOLAM HCL 2 MG/2ML IJ SOLN
2.0000 mg | Freq: Once | INTRAMUSCULAR | Status: AC
  Administered 2015-10-03: 2 mg via INTRAVENOUS

## 2015-10-03 MED ORDER — FENTANYL CITRATE (PF) 100 MCG/2ML IJ SOLN
100.0000 ug | Freq: Once | INTRAMUSCULAR | Status: AC
  Administered 2015-10-03: 100 ug via INTRAVENOUS

## 2015-10-03 MED ORDER — SUCCINYLCHOLINE CHLORIDE 20 MG/ML IJ SOLN
INTRAMUSCULAR | Status: AC | PRN
  Administered 2015-10-03: 120 mg via INTRAVENOUS

## 2015-10-03 MED ORDER — SODIUM CHLORIDE 0.9 % IV SOLN: 250.0000 mL | INTRAVENOUS | Status: DC | PRN

## 2015-10-03 MED ORDER — PROPOFOL 1000 MG/100ML IV EMUL
INTRAVENOUS | Status: AC | PRN
  Administered 2015-10-03: 22.09 ug/kg/min via INTRAVENOUS
  Administered 2015-10-03: 35 ug/kg/min via INTRAVENOUS

## 2015-10-03 MED ORDER — PANTOPRAZOLE SODIUM 40 MG PO TBEC: 40.0000 mg | DELAYED_RELEASE_TABLET | Freq: Two times a day (BID) | ORAL | Status: DC

## 2015-10-03 MED ORDER — NALOXONE HCL 2 MG/2ML IJ SOSY
2.0000 mg | PREFILLED_SYRINGE | Freq: Once | INTRAMUSCULAR | Status: AC
  Administered 2015-10-03: 2 mg via INTRAMUSCULAR

## 2015-10-03 MED ORDER — PROPOFOL 1000 MG/100ML IV EMUL
INTRAVENOUS | Status: AC
  Administered 2015-10-03: 35 ug/kg/min via INTRAVENOUS
  Filled 2015-10-03: qty 100

## 2015-10-03 MED ORDER — ETOMIDATE 2 MG/ML IV SOLN
INTRAVENOUS | Status: AC | PRN
  Administered 2015-10-03: 20 mg via INTRAVENOUS

## 2015-10-03 NOTE — ED Notes (Addendum)
IV access x times 4 unsuccessfully. Dr. Lenard LancePaduchowski attempted Ultrasound IV access. Patient combative and fighting staff, patient yelling "just let me die."

## 2015-10-03 NOTE — ED Notes (Signed)
Report given to Rachel, RN.
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
## 2015-10-03 NOTE — ED Provider Notes (Signed)
[facility] Emergency Department Provider Note  Time seen: 7:05 PM  I have reviewed the triage vital signs and the nursing notes.   HISTORY  Chief Complaint Drug Overdose    HPI Courtney Hebert is a 40 y.o. female with a past medical history of chronic back pain, on methadone, presents to the emergency department unresponsive. According to EMS report the patient came home early from work today with a migraine headache. She shot herself in the bathroom. At some point her children and the landlord became concerned, attempted to open the bathroom door and found the patient on her sponsor on the floor. EMS was called. EMS states the patient was sitting up seemed very lethargic and unable to answer questions, they gave the patient 2 mg of intranasal Narcan and state some improvement in route to the hospital, but remains somnolent and lethargic and combative upon arrival. Upon my evaluation the patient is extremely agitated, uncooperative, fighting with staff when attempting to get an IV, saying she wants to go home. Patient does have a remote history of heroin but states she quit years ago.     Past Medical History  Diagnosis Date  . Chronic back pain     There are no active problems to display for this patient.   History reviewed. No pertinent past surgical history.  Current Outpatient Rx  Name  Route  Sig  Dispense  Refill  . lidocaine (XYLOCAINE) 2 % solution   Mouth/Throat   Use as directed 20 mLs in the mouth or throat as needed for mouth pain.   100 mL   0     Allergies Vancomycin  No family history on file.  Social History Social History  Substance Use Topics  . Smoking status: Current Every Day Smoker  . Smokeless tobacco: None  . Alcohol Use: Yes    Review of Systems Unable to obtain a review of systems is a patient is not willing/unable to answer questions.  ____________________________________________   PHYSICAL EXAM:  VITAL  SIGNS: ED Triage Vitals  Enc Vitals Group     BP 10/03/15 1832 140/89 mmHg     Pulse Rate 10/03/15 1832 90     Resp 10/03/15 1832 24     Temp 10/03/15 1832 97.6 F (36.4 C)     Temp src --      SpO2 10/03/15 1832 95 %     Weight 10/03/15 1832 332 lb 10.8 oz (150.9 kg)     Height 10/03/15 1832 5\' 6"  (1.676 m)     Head Cir --      Peak Flow --      Pain Score --      Pain Loc --      Pain Edu? --      Excl. in GC? --     Constitutional: Alert, slurred speech, agitated, combative at times. Eyes: Normal exam ENT   Head: Normocephalic and atraumatic   Mouth/Throat: Mucous membranes are moist. Cardiovascular: Normal rate, regular rhythm. No murmur Respiratory: Normal respiratory effort without tachypnea nor retractions. Breath sounds are clear  Gastrointestinal: Soft, no reaction to abdominal palpation. Musculoskeletal: Patient moves all extremities. Neurologic:  Patient is somnolent with slurred speech, intermittently will become agitated and combative. Yelling at staff. Skin:  Skin is warm, dry and intact.  Psychiatric: Mood and affect are normal. Speech and behavior are normal.  ____________________________________________    EKG  EKG reviewed and interpreted by myself shows normal sinus rhythm at 97  bpm, narrow QRS, normal axis, normal intervals besides a slightly prolonged QTC of 557 ms. Nonspecific but no concerning ST changes.  ____________________________________________    RADIOLOGY  Chest x-ray shows appropriate ET tube placement, mild interstitial edema. CT head on my read does not appear to show any acute abnormalities.     INITIAL IMPRESSION / ASSESSMENT AND PLAN / ED COURSE  Pertinent labs & imaging results that were available during my care of the patient were reviewed by me and considered in my medical decision making (see chart for details).  Patient presents the emergency department after being found unresponsive on her bathroom floor. Patient  uses methadone daily as prescribed. Has remote history of when use but denies any use in years. One of the nurses stated the patient did admit to her heroin use today, but is not clear if the patient mentioned she used methadone her heroin today. Patient is unable to provide an adequate history, combative, agitated, yells at staff just let me die I want to go home.  Patient appears to be under the influence, however we are unable to obtain labs at this time do to agitation. We will dose intramuscular Narcan and monitor for improvement so that hopefully we can work the patient up appropriately without having to sedate her.  Patient has acutely decompensated in the emergency department. Has become unresponsive to sternal rub, with an oxygen saturation dropping as low as 70%. At this point the decision was made to intubate. Patient intubated successfully on first attempt. IV access obtained, labs have been sent, we will start the patient on a propofol drip.  Given the patient's altered mental status, unresponsiveness, we will continue with full medical workup including labs, urinalysis, urine drug screen, CT head, chest x-ray. Patient currently with 100% oxygen saturation status post intubation. She has had several episodes of vomiting which began after intubation so hopefully the airway is adequately protected at this time.  Labs show no acute abnormality. CT appears normal. I have added a fentanyl drip in addition to the patient's propofol as the patient is fighting the ventilator, and occasionally is moving. At this time I do not have a clear cause for the patient's unresponsiveness and acute decompensation in the emergency department besides a possibility of methadone and benzodiazepines which could've caused respiratory depression as the Narcan wearing off the EMS administered intranasally. Patient is intubated currently, is adequately sedated with the addition of fentanyl. We'll admit the patient to the  intensive care unit for further workup and treatment.   INTUBATION Performed by: Minna Antis  Required items: required blood products, implants, devices, and special equipment available Patient identity confirmed: provided demographic data and hospital-assigned identification number Time out: Immediately prior to procedure a "time out" was called to verify the correct patient, procedure, equipment, support staff and site/side marked as required.  Indications: Unresponsiveness, airway protection, respiratory failure   Intubation method: 4.0 Glidescope Laryngoscopy   Preoxygenation: 100% BVM  Sedatives: 20 Etomidate Paralytic: 120 Succinylcholine  Tube Size: 7.5 cuffed  Post-procedure assessment: chest rise and ETCO2 monitor Breath sounds: equal and absent over the epigastrium Tube secured with: ETT holder Chest x-ray interpreted by radiologist and me.  Chest x-ray findings: endotracheal tube in appropriate position  Patient tolerated the procedure well with no immediate complications.  CRITICAL CARE Performed by: Minna Antis   Total critical care time: 60 minutes  Critical care time was exclusive of separately billable procedures and treating other patients.  Critical care was necessary to  treat or prevent imminent or life-threatening deterioration.  Critical care was time spent personally by me on the following activities: development of treatment plan with patient and/or surrogate as well as nursing, discussions with consultants, evaluation of patient's response to treatment, examination of patient, obtaining history from patient or surrogate, ordering and performing treatments and interventions, ordering and review of laboratory studies, ordering and review of radiographic studies, pulse oximetry and re-evaluation of patient's condition.     ____________________________________________   FINAL CLINICAL IMPRESSION(S) / ED DIAGNOSES  Unresponsiveness   altered mental status  Minna Antis, MD 10/03/15 2243

## 2015-10-04 ENCOUNTER — Inpatient Hospital Stay: Payer: Self-pay

## 2015-10-04 ENCOUNTER — Inpatient Hospital Stay: Payer: MEDICAID

## 2015-10-04 LAB — CBC
HCT: 41.9 % (ref 35.0–47.0)
Hemoglobin: 13.4 g/dL (ref 12.0–16.0)
MCH: 28.1 pg (ref 26.0–34.0)
MCHC: 31.9 g/dL — AB (ref 32.0–36.0)
MCV: 88.1 fL (ref 80.0–100.0)
Platelets: 258 10*3/uL (ref 150–440)
RBC: 4.76 MIL/uL (ref 3.80–5.20)
RDW: 17.5 % — AB (ref 11.5–14.5)
WBC: 12.4 10*3/uL — ABNORMAL HIGH (ref 3.6–11.0)

## 2015-10-04 LAB — BASIC METABOLIC PANEL
Anion gap: 10 (ref 5–15)
BUN: 12 mg/dL (ref 6–20)
CALCIUM: 8.5 mg/dL — AB (ref 8.9–10.3)
CO2: 31 mmol/L (ref 22–32)
CREATININE: 0.68 mg/dL (ref 0.44–1.00)
Chloride: 97 mmol/L — ABNORMAL LOW (ref 101–111)
GFR calc non Af Amer: >60 mL/min (ref >60)
Glucose, Bld: 131 mg/dL — ABNORMAL HIGH (ref 65–99)
Potassium: 3.6 mmol/L (ref 3.5–5.1)
SODIUM: 138 mmol/L (ref 135–145)

## 2015-10-04 LAB — GLUCOSE, CAPILLARY: GLUCOSE-CAPILLARY: 123 mg/dL — AB (ref 65–99)

## 2015-10-04 LAB — BLOOD GAS, ARTERIAL
Acid-Base Excess: 11.1 mmol/L — ABNORMAL HIGH (ref 0.0–3.0)
Bicarbonate: 37.8 mEq/L — ABNORMAL HIGH (ref 21.0–28.0)
FIO2: 0.5
MECHANICAL RATE: 16
O2 Saturation: 92.7 %
PATIENT TEMPERATURE: 37
PEEP: 5 cmH2O
PO2 ART: 64 mmHg — AB (ref 83.0–108.0)
VT: 500 mL
pCO2 arterial: 57 mmHg — ABNORMAL HIGH (ref 32.0–48.0)
pH, Arterial: 7.43 (ref 7.350–7.450)

## 2015-10-04 LAB — PHOSPHORUS: PHOSPHORUS: 3.5 mg/dL (ref 2.5–4.6)

## 2015-10-04 LAB — TRIGLYCERIDES: TRIGLYCERIDES: 214 mg/dL — AB (ref <150)

## 2015-10-04 LAB — MAGNESIUM: MAGNESIUM: 2.2 mg/dL (ref 1.7–2.4)

## 2015-10-04 LAB — MRSA PCR SCREENING: MRSA by PCR: POSITIVE — AB

## 2015-10-04 MED ORDER — CHLORHEXIDINE GLUCONATE CLOTH 2 % EX PADS
6.0000 | MEDICATED_PAD | Freq: Every day | CUTANEOUS | Status: DC
  Administered 2015-10-04 – 2015-10-07 (×3): 6 via TOPICAL

## 2015-10-04 MED ORDER — VECURONIUM BROMIDE 10 MG IV SOLR
INTRAVENOUS | Status: AC
  Administered 2015-10-04: 09:00:00
  Filled 2015-10-04: qty 10

## 2015-10-04 MED ORDER — PIPERACILLIN-TAZOBACTAM 3.375 G IVPB
3.3750 g | Freq: Three times a day (TID) | INTRAVENOUS | Status: DC
  Administered 2015-10-04 – 2015-10-06 (×5): 3.375 g via INTRAVENOUS
  Filled 2015-10-04 (×8): qty 50

## 2015-10-04 MED ORDER — STERILE WATER FOR INJECTION IJ SOLN
INTRAMUSCULAR | Status: AC
  Administered 2015-10-04: 09:00:00
  Filled 2015-10-04: qty 10

## 2015-10-04 MED ORDER — FENTANYL 2500MCG IN NS 250ML (10MCG/ML) PREMIX INFUSION
25.0000 ug/h | INTRAVENOUS | Status: DC
  Administered 2015-10-04 (×4): 400 ug/h via INTRAVENOUS
  Administered 2015-10-05: 200 ug/h via INTRAVENOUS
  Filled 2015-10-04 (×4): qty 250

## 2015-10-04 MED ORDER — DEXMEDETOMIDINE HCL IN NACL 400 MCG/100ML IV SOLN
0.2000 ug/kg/h | INTRAVENOUS | Status: DC
  Administered 2015-10-04: 1.1 ug/kg/h via INTRAVENOUS
  Administered 2015-10-04 (×2): 1 ug/kg/h via INTRAVENOUS
  Administered 2015-10-04: 0.7 ug/kg/h via INTRAVENOUS
  Administered 2015-10-05 (×3): 0.8 ug/kg/h via INTRAVENOUS
  Administered 2015-10-05: 1.2 ug/kg/h via INTRAVENOUS
  Administered 2015-10-06: 0.4 ug/kg/h via INTRAVENOUS
  Filled 2015-10-04 (×14): qty 100

## 2015-10-04 MED ORDER — NOREPINEPHRINE BITARTRATE 1 MG/ML IV SOLN
2.0000 ug/min | INTRAVENOUS | Status: DC
  Administered 2015-10-04: 2 ug/min via INTRAVENOUS
  Administered 2015-10-05: 1 ug/min via INTRAVENOUS
  Filled 2015-10-04: qty 4

## 2015-10-04 MED ORDER — PANTOPRAZOLE SODIUM 40 MG IV SOLR
40.0000 mg | Freq: Two times a day (BID) | INTRAVENOUS | Status: DC
  Administered 2015-10-04 – 2015-10-05 (×4): 40 mg via INTRAVENOUS
  Filled 2015-10-04 (×4): qty 40

## 2015-10-04 MED ORDER — PIPERACILLIN-TAZOBACTAM 3.375 G IVPB
3.3750 g | Freq: Two times a day (BID) | INTRAVENOUS | Status: DC
  Administered 2015-10-04: 3.375 g via INTRAVENOUS

## 2015-10-04 MED ORDER — HALOPERIDOL LACTATE 5 MG/ML IJ SOLN
5.0000 mg | Freq: Once | INTRAMUSCULAR | Status: AC
  Administered 2015-10-04: 5 mg via INTRAVENOUS
  Filled 2015-10-04: qty 1

## 2015-10-04 MED ORDER — MUPIROCIN 2 % EX OINT
1.0000 "application " | TOPICAL_OINTMENT | Freq: Two times a day (BID) | CUTANEOUS | Status: DC
  Administered 2015-10-04 – 2015-10-07 (×8): 1 via NASAL
  Filled 2015-10-04: qty 22

## 2015-10-04 MED ORDER — PROPOFOL 1000 MG/100ML IV EMUL
5.0000 ug/kg/min | INTRAVENOUS | Status: DC
  Administered 2015-10-04: 80 ug/kg/min via INTRAVENOUS
  Administered 2015-10-04: 30 ug/kg/min via INTRAVENOUS
  Administered 2015-10-04: 70 ug/kg/min via INTRAVENOUS
  Administered 2015-10-04: 80 ug/kg/min via INTRAVENOUS
  Administered 2015-10-04: 70 ug/kg/min via INTRAVENOUS
  Administered 2015-10-04: 80 ug/kg/min via INTRAVENOUS
  Filled 2015-10-04 (×10): qty 100

## 2015-10-04 MED ORDER — MIDAZOLAM HCL 2 MG/2ML IJ SOLN
1.0000 mg | INTRAMUSCULAR | Status: DC | PRN
Start: 2015-10-04 — End: 2015-10-08
  Administered 2015-10-05: 2 mg via INTRAVENOUS
  Filled 2015-10-04 (×2): qty 2

## 2015-10-04 MED ORDER — DEXMEDETOMIDINE HCL IN NACL 200 MCG/50ML IV SOLN: 0.0000 ug/kg/h | INTRAVENOUS | Status: DC

## 2015-10-04 MED ORDER — MIDAZOLAM HCL 2 MG/2ML IJ SOLN
2.0000 mg | INTRAMUSCULAR | Status: DC | PRN
  Administered 2015-10-04 (×2): 2 mg via INTRAVENOUS
  Filled 2015-10-04 (×2): qty 2

## 2015-10-04 NOTE — Progress Notes (Signed)
Initial Nutrition Assessment     INTERVENTION:  If unable to extubate in next 24-48 hr recommend starting nutrition support   NUTRITION DIAGNOSIS:   Inadequate oral intake related to acute illness as evidenced by NPO status.    GOAL:   Patient will meet greater than or equal to 90% of their needs    MONITOR:   Vent status  REASON FOR ASSESSMENT:   Ventilator    ASSESSMENT:   40 y/o female admitted with overdose, on vent.    Past Medical History  Diagnosis Date  . Chronic back pain    Medications reviewed diprivan (760ml/hr providing 1600 kcals in 24 hr period), protonix Labs reviewed glucose 131  OG noted in stomach  Diet Order:  Diet NPO time specified  Skin:  Reviewed, no issues  Last BM:  PTA  Height:   Ht Readings from Last 1 Encounters:  10/04/15 5\' 5"  (1.651 m)    Weight:   Wt Readings from Last 1 Encounters:  10/04/15 318 lb 9 oz (144.5 kg)    Ideal Body Weight:     BMI:  Body mass index is 53.01 kg/(m^2).  Estimated Nutritional Needs:   Kcal:  1254-1425 kcals (22-25 kcals/IBW kg per ASPEN guidelines)  Protein:  114-143 g/d (2-2.5 g/kg)   Fluid:  per MD  EDUCATION NEEDS:   No education needs identified at this time  Phylisha Dix B. Freida BusmanAllen, RD, LDN [phone] (pager) Weekend/On-Call pager [phone])

## 2015-10-04 NOTE — Progress Notes (Signed)
Plan for CVL, remains delirious, will keep intubated,sedated

## 2015-10-04 NOTE — Progress Notes (Signed)
Husband, Gery PrayBarry, called to check on pt.  Husband is very concerned over pt's status and will be here in the morning to see pt.  Husband seems to be very  confrontational at this time.

## 2015-10-04 NOTE — Progress Notes (Signed)
Some admission documentation still pending.  Pt on the vent, unable to talk.  There were not any family members that accompanied pt to ICU.

## 2015-10-04 NOTE — Progress Notes (Signed)
Pharmacy note: Antibiotic renal dose consult; Patient CrCl est~ 135 mL/min and not currently on any antibiotics that require renal adjustment. Pharmacy will follow on.

## 2015-10-04 NOTE — Procedures (Signed)
Central Venous Catheter Placement: Indication: Patient receiving vesicant or irritant drug.; Patient receiving intravenous therapy for longer than 5 days.; Patient has limited or no vascular access.   Consent:emergent  Risks and benefits explained in detail including risk of infection, bleeding, respiratory failure and death..   Hand washing performed prior to starting the procedure.   Procedure: An active timeout was performed and correct patient, name, & ID confirmed.  After explaining risk and benefits, patient was positioned correctly for central venous access. Patient was prepped using strict sterile technique including chlorohexadine preps, sterile drape, sterile gown and sterile gloves.  The area was prepped, draped and anesthetized in the usual sterile manner. Patient comfort was obtained.  A triple lumen catheter was placed in RT  Internal Jugular Vein There was good blood return, catheter caps were placed on lumens, catheter flushed easily, the line was secured and a sterile dressing and BIO-PATCH applied.   Ultrasound was used to visualize vasculature and guidance of needle.   Number of Attempts: 1 Complications:none Estimated Blood Loss: none Chest Radiograph indicated and ordered.  Operator: Ilani Otterson.   Estelle Skibicki David Myan Suit, M.D.  [facility]  Medical Director ICU-[facility]  Medical Director [facility] Cardio-Pulmonary Department     

## 2015-10-04 NOTE — Progress Notes (Signed)
When pt arrived to room 12, pt was intubated, sedated on propofol and fentanyl drips but was combative, opened eyes spontaneously, kicking legs, would do what you asked her to do but would immediately return to being combative. Pt was very agitated.  [facility] physician noted pt was extremely agitated and ordered pt start on precedex along with all other sedation. Sitter was placed at bedside for pt safety and mits were placed on pt's hands.  Pt was trying to pull out the tube.  All drips were at maximum dosage, yet pt was still combative .  PRN versed was ordered and a one time dose of haldol was given.  At that time, pt began to calm down.  Lab was able to come and draw morning labs at that time.  Pt remains calm at this time.  Pupils are equal and reactive.  Sitter remains in place.  Vitals remain stable.

## 2015-10-05 DIAGNOSIS — T401X4A Poisoning by heroin, undetermined, initial encounter: Secondary | ICD-10-CM

## 2015-10-05 DIAGNOSIS — F1910 Other psychoactive substance abuse, uncomplicated: Secondary | ICD-10-CM

## 2015-10-05 LAB — CBC WITH DIFFERENTIAL/PLATELET
BASOS ABS: 0 10*3/uL (ref 0–0.1)
Basophils Relative: 0 %
EOS ABS: 0.2 10*3/uL (ref 0–0.7)
HCT: 40 % (ref 35.0–47.0)
Hemoglobin: 13.3 g/dL (ref 12.0–16.0)
Lymphs Abs: 1.2 10*3/uL (ref 1.0–3.6)
MCH: 28.5 pg (ref 26.0–34.0)
MCHC: 33.1 g/dL (ref 32.0–36.0)
MCV: 86 fL (ref 80.0–100.0)
Monocytes Absolute: 0.7 10*3/uL (ref 0.2–0.9)
Monocytes Relative: 5 %
Neutro Abs: 11.9 10*3/uL — ABNORMAL HIGH (ref 1.4–6.5)
Neutrophils Relative %: 85 %
PLATELETS: 232 10*3/uL (ref 150–440)
RBC: 4.65 MIL/uL (ref 3.80–5.20)
RDW: 17.3 % — ABNORMAL HIGH (ref 11.5–14.5)
WBC: 14 10*3/uL — AB (ref 3.6–11.0)

## 2015-10-05 LAB — BASIC METABOLIC PANEL
ANION GAP: 7 (ref 5–15)
BUN: 12 mg/dL (ref 6–20)
CO2: 32 mmol/L (ref 22–32)
Calcium: 8 mg/dL — ABNORMAL LOW (ref 8.9–10.3)
Chloride: 99 mmol/L — ABNORMAL LOW (ref 101–111)
Creatinine, Ser: 0.92 mg/dL (ref 0.44–1.00)
GFR calc Af Amer: >60 mL/min (ref >60)
Glucose, Bld: 119 mg/dL — ABNORMAL HIGH (ref 65–99)
POTASSIUM: 3.5 mmol/L (ref 3.5–5.1)
SODIUM: 138 mmol/L (ref 135–145)

## 2015-10-05 MED ORDER — CHLORHEXIDINE GLUCONATE 0.12% ORAL RINSE (MEDLINE KIT)
15.0000 mL | Freq: Two times a day (BID) | OROMUCOSAL | Status: DC
  Administered 2015-10-05 (×2): 15 mL via OROMUCOSAL
  Filled 2015-10-05 (×5): qty 15

## 2015-10-05 MED ORDER — POTASSIUM CHLORIDE 10 MEQ/50ML IV SOLN
10.0000 meq | INTRAVENOUS | Status: AC
  Administered 2015-10-05 (×2): 10 meq via INTRAVENOUS
  Filled 2015-10-05 (×2): qty 50

## 2015-10-05 MED ORDER — FUROSEMIDE 10 MG/ML IJ SOLN
40.0000 mg | Freq: Once | INTRAMUSCULAR | Status: AC
  Administered 2015-10-05: 40 mg via INTRAVENOUS
  Filled 2015-10-05: qty 4

## 2015-10-05 MED ORDER — ZIPRASIDONE MESYLATE 20 MG IM SOLR
20.0000 mg | Freq: Two times a day (BID) | INTRAMUSCULAR | Status: DC | PRN
  Administered 2015-10-05 (×2): 20 mg via INTRAMUSCULAR
  Filled 2015-10-05 (×3): qty 20

## 2015-10-05 MED ORDER — ANTISEPTIC ORAL RINSE SOLUTION (CORINZ)
7.0000 mL | Freq: Four times a day (QID) | OROMUCOSAL | Status: DC
  Administered 2015-10-05 (×2): 7 mL via OROMUCOSAL
  Filled 2015-10-05 (×9): qty 7

## 2015-10-05 NOTE — Progress Notes (Signed)
Pharmacy note: Antibiotic renal dose consult; Patient CrCl est~ 117 mL/min and on Zosyn 3.375 g IV q8h EI. No renal dose adjustment needed at this time.  Pharmacy will follow on.

## 2015-10-05 NOTE — Progress Notes (Signed)
Paged Dr. Belia HemanKasa, no call back at this time.  Called Elink,and having MD evaluated New ABG results.

## 2015-10-05 NOTE — Progress Notes (Signed)
Husband at bedside, Removed patients ring from left hand and took home.  Yellow colored metal ring with stones noted before husband placed it in his pocket. Dondra SpryGail CNA in room as wittness.

## 2015-10-05 NOTE — Progress Notes (Signed)
Pt. Extubated to room air,sat 95.No apparent distress noted at this time. 

## 2015-10-05 NOTE — Progress Notes (Signed)
HFNC initiated at 40% & 40 lpm. O2 sat was 98-99% on [place] @ 3 lpm. Sat unchanged on HFNC.

## 2015-10-05 NOTE — Progress Notes (Signed)
[facility] Consultation      Name: Courtney Hebert MRN: [id number] DOB: 05/05/1976    ADMISSION DATE:  10/03/2015    CHIEF COMPLAINT:   Acute resp failure   HISTORY OF PRESENT ILLNESS  Remains intubated,sedated Critically ill, fio2 decreased to 40% Will attempt SAT/SBt today      Review of Systems  Unable to perform ROS: critical illness      VITAL SIGNS    Temp:  [98.5 F (36.9 C)-98.9 F (37.2 C)] 98.9 F (37.2 C) (05/07 0400) Pulse Rate:  [62-67] 66 (05/07 0600) Resp:  [16-24] 18 (05/07 0600) BP: (88-128)/(44-61) 124/52 mmHg (05/07 0600) SpO2:  [90 %-96 %] 96 % (05/07 0600) FiO2 (%):  [40 %-50 %] 40 % (05/07 0352) Weight:  [318 lb 9 oz (144.5 kg)] 318 lb 9 oz (144.5 kg) (05/07 0400) HEMODYNAMICS:   VENTILATOR SETTINGS: Vent Mode:  [-] PRVC FiO2 (%):  [40 %-50 %] 40 % Set Rate:  [16 bmp] 16 bmp Vt Set:  [500 mL] 500 mL PEEP:  [5 cmH20] 5 cmH20 INTAKE / OUTPUT:  Intake/Output Summary (Last 24 hours) at 10/05/15 0738 Last data filed at 10/05/15 5110  Gross per 24 hour  Intake 3416.81 ml  Output   1650 ml  Net 1766.81 ml       PHYSICAL EXAM   Physical Exam  Constitutional: No distress.  HENT:  Head: Normocephalic and atraumatic.  Eyes: Pupils are equal, round, and reactive to light. No scleral icterus.  Neck: Normal range of motion. Neck supple.  Cardiovascular: Normal rate and regular rhythm.   No murmur heard. Pulmonary/Chest: No respiratory distress. She has no wheezes. She has rales.  Abdominal: Soft. She exhibits no distension. There is no tenderness.  Musculoskeletal: She exhibits no edema.  Neurological: She displays normal reflexes. Coordination normal.  gcs<8T  Skin: Skin is warm. No rash noted. She is diaphoretic.       LABS   LABS:  CBC  Recent Labs Lab 10/03/15 1940 10/04/15 0516 10/05/15 0503  WBC 13.4* 12.4* 14.0*  HGB 14.2 13.4 13.3  HCT 44.0 41.9 40.0  PLT 268 258 232   Coag's No  results for input(s): APTT, INR in the last 168 hours. BMET  Recent Labs Lab 10/03/15 1940 10/04/15 0516 10/05/15 0503  NA 136 138 138  K 3.9 3.6 3.5  CL 98* 97* 99*  CO2 29 31 32  BUN 11 12 12   CREATININE 0.83 0.68 0.92  GLUCOSE 136* 131* 119*   Electrolytes  Recent Labs Lab 10/03/15 1940 10/04/15 0516 10/05/15 0503  CALCIUM 8.7* 8.5* 8.0*  MG  --  2.2  --   PHOS  --  3.5  --    Sepsis Markers No results for input(s): LATICACIDVEN, PROCALCITON, O2SATVEN in the last 168 hours. ABG  Recent Labs Lab 10/03/15 2055 10/04/15 0503  PHART 7.56* 7.43  PCO2ART 36 57*  PO2ART 155* 64*   Liver Enzymes  Recent Labs Lab 10/03/15 1940  AST 29  ALT 22  ALKPHOS 80  BILITOT 0.7  ALBUMIN 3.7   Cardiac Enzymes  Recent Labs Lab 10/03/15 1940  TROPONINI <0.03   Glucose  Recent Labs Lab 10/04/15 0012  GLUCAP 123*     Recent Results (from the past 240 hour(s))  MRSA PCR Screening     Status: Abnormal   Collection Time: 10/04/15 12:46 AM  Result Value Ref Range Status   MRSA by PCR POSITIVE (A) NEGATIVE Final  Comment: CRITICAL RESULT CALLED TO, READ BACK BY AND VERIFIED WITH: RENEE BABB AT 0232 ON 10/04/15 RWW        The GeneXpert MRSA Assay (FDA approved for NASAL specimens only), is one component of a comprehensive MRSA colonization surveillance program. It is not intended to diagnose MRSA infection nor to guide or monitor treatment for MRSA infections.      Current facility-administered medications:  .  0.9 %  sodium chloride infusion, 250 mL, Intravenous, PRN, Bincy S Varughese, NP .  antiseptic oral rinse solution (CORINZ), 7 mL, Mouth Rinse, QID, Flora Lipps, MD, 7 mL at 10/05/15 0416 .  chlorhexidine gluconate (SAGE KIT) (PERIDEX) 0.12 % solution 15 mL, 15 mL, Mouth Rinse, BID, Flora Lipps, MD, 15 mL at 10/05/15 0728 .  Chlorhexidine Gluconate Cloth 2 % PADS 6 each, 6 each, Topical, Q0600, Bincy S Varughese, NP, 6 each at 10/04/15 0600 .   dexmedetomidine (PRECEDEX) 400 MCG/100ML (4 mcg/mL) infusion, 0.4-1.2 mcg/kg/hr, Intravenous, Continuous, Bincy S Varughese, NP, Last Rate: 30.2 mL/hr at 10/05/15 0727, 0.8 mcg/kg/hr at 10/05/15 0727 .  fentaNYL 2549mg in NS 2547m(1043mml) infusion-PREMIX, 25-400 mcg/hr, Intravenous, Continuous, Bincy S Varughese, NP, Last Rate: 20 mL/hr at 10/05/15 0455, 200 mcg/hr at 10/05/15 0455 .  heparin injection 5,000 Units, 5,000 Units, Subcutaneous, Q8H, Bincy S Varughese, NP, 5,000 Units at 10/05/15 0541 .  midazolam (VERSED) injection 1-2 mg, 1-2 mg, Intravenous, Q2H PRN, Bincy S Varughese, NP .  midazolam (VERSED) injection 2 mg, 2 mg, Intravenous, Q1H PRN, Bincy S Varughese, NP, 2 mg at 10/04/15 2337 .  mupirocin ointment (BACTROBAN) 2 % 1 application, 1 application, Nasal, BID, Bincy S Varughese, NP, 1 application at 05/44/07/4727 .  norepinephrine (LEVOPHED) 4 mg in dextrose 5 % 250 mL (0.016 mg/mL) infusion, 2-50 mcg/min, Intravenous, Continuous, Bincy S Varughese, NP, Last Rate: 3.8 mL/hr at 10/05/15 0425, 1 mcg/min at 10/05/15 0425 .  pantoprazole (PROTONIX) injection 40 mg, 40 mg, Intravenous, Q12H, KurFlora LippsD, 40 mg at 10/04/15 2228 .  piperacillin-tazobactam (ZOSYN) IVPB 3.375 g, 3.375 g, Intravenous, Q8H, Sheema M Hallaji, RPH, 3.375 g at 10/05/15 0541 .  potassium chloride 10 mEq in 50 mL *CENTRAL LINE* IVPB, 10 mEq, Intravenous, Q1 Hr x 2, MurBrand MalesD, 10 mEq at 10/05/15 0724259MAGING    Dg Chest Port 1 View  10/04/2015  CLINICAL DATA:  Hypoxia EXAM: PORTABLE CHEST 1 VIEW COMPARISON:  Oct 04, 2015 study obtained earlier in the day FINDINGS: There is a new central catheter on the right with the tip in the right atrium just beyond the cavoatrial junction. Endotracheal tube tip is 2.5 cm above the carina. Nasogastric tube tip and side port below the diaphragm. No pneumothorax. There is patchy atelectasis in the right perihilar and left base regions. No new opacity. Heart size and  pulmonary vascularity are normal. No adenopathy evident. IMPRESSION: Central catheter tip in right atrium, slightly beyond the cavoatrial junction. Tube positions as described. No pneumothorax. Areas of atelectasis in the right perihilar and left base regions. No new opacity. No change in cardiac silhouette. Electronically Signed   By: WilLowella GripI M.D.   On: 10/04/2015 09:23      Indwelling Urinary Catheter continued, requirement due to   Reason to continue Indwelling Urinary Catheter for strict Intake/Output monitoring for hemodynamic instability   Central Line continued, requirement due to   Reason to continue CenKinder Morgan Energynitoring of central venous pressure or other hemodynamic parameters   Ventilator  continued, requirement due to, resp failure    Ventilator Sedation RASS 0 to -2   SIGNIFICANT EVENTS: 5/5 intubated>>  LINES/TUBES: 5/5 ET tube>> 5/6 RT CVL IJ>>  MICRO DATA: MRSA PCR POS Urine  Blood Resp   ANTIMICROBIALS:  sozyn 5/6>>   ASSESSMENT/PLAN    40 year old female with a history of polysubstance abuse, now admitted with methadone/heroine overdose leading to encephalopathy and resp failure inability to protect airway with aspiration pneumonia. Currently intubated and on mechanical ventilation.  ASSESSMENT / PLAN:  PULMONARY A: Acute hypoxemic respiratory failure P:  -Respiratory Failure -continue Full MV support -continue Bronchodilator Therapy -Wean Fio2 and PEEP as tolerated -will perform SAT/SBt when respiratory parameters are met   CARDIOVASCULAR A:  No active issues P:  Continue ICU montioring Monitor vital signs  RENAL A:  No active issues P:  Replace electrolytes per ICU protocol  GASTROINTESTINAL A:  No active issues P:  Protonix for GI prophylaxis  HEMATOLOGIC A:  No active issues P:  SCDs Heparin subcutaneous  INFECTIOUS A:  Leukocytosis related to stress response P:  abx for  aspiration pneumonia  ENDOCRINE A:  No active issues P:  Blood glucose intermittently   NEUROLOGIC A:  Polysubstance abuse tobacco abuse Migraine P:  RASS goal: 0 -will wean sedation and assess neuro status    I have attempted to reach husband, he is traveling. Will update him when at bedside  I have personally obtained a history, examined the patient, evaluated laboratory and independently reviewed  imaging results, formulated the assessment and plan and placed orders.  The Patient requires high complexity decision making for assessment and support, frequent evaluation and titration of therapies, application of advanced monitoring technologies and extensive interpretation of multiple databases. Critical Care Time devoted to patient care services described in this note is 35 minutes.   Overall, patient is critically ill, prognosis is guarded. Patient at high risk for cardiac arrest and death.    Corrin Parker, M.D.  [facility]  Medical Director [place] Director [facility] Cardio-Pulmonary Department

## 2015-10-05 NOTE — Progress Notes (Signed)
[facility] Nursing ICU Electrolyte Replacement Protocol  Patient Name: Courtney Hebert DOB: [birth year] MRN: [id number]  Date of Service  10/05/2015   HPI/Events of Note    Recent Labs Lab 10/03/15 1940 10/04/15 0516 10/05/15 0503  NA 136 138 138  K 3.9 3.6 3.5  CL 98* 97* 99*  CO2 29 31 32  GLUCOSE 136* 131* 119*  BUN 11 12 12   CREATININE 0.83 0.68 0.92  CALCIUM 8.7* 8.5* 8.0*  MG  --  2.2  --   PHOS  --  3.5  --     Estimated Creatinine Clearance: 116.9 mL/min (by C-G formula based on Cr of 0.92).  Intake/Output      05/06 0701 - 05/07 0700   I.V. (mL/kg) 1513.5 (10.5)   Total Intake(mL/kg) 1513.5 (10.5)   Urine (mL/kg/hr) 1250 (0.4)   Emesis/NG output 400 (0.1)   Total Output 1650   Net -136.5        - I/O DETAILED x24h    Total I/O In: -  Out: 850 [Urine:450; Emesis/NG output:400] - I/O THIS SHIFT    ASSESSMENT   eICURN Interventions  K+ replaced using protocol. MD informed   ASSESSMENT: MAJOR ELECTROLYTE    Merita NortonFrye, Shakya Sebring Nicole 10/05/2015, 5:45 AM

## 2015-10-05 NOTE — Progress Notes (Signed)
PT beginning to be more verbal.  Pt is spitting and asking for husband.  I explained to pt that husband went home to eat and shower.  She does not believe me, stating "Bullshit, you are a liar."  Pt expressing "I just want to die."  Sitter at bedside.

## 2015-10-06 ENCOUNTER — Inpatient Hospital Stay: Payer: MEDICAID

## 2015-10-06 DIAGNOSIS — F319 Bipolar disorder, unspecified: Secondary | ICD-10-CM

## 2015-10-06 DIAGNOSIS — F3132 Bipolar disorder, current episode depressed, moderate: Secondary | ICD-10-CM

## 2015-10-06 DIAGNOSIS — Z720 Tobacco use: Secondary | ICD-10-CM

## 2015-10-06 DIAGNOSIS — F1120 Opioid dependence, uncomplicated: Secondary | ICD-10-CM

## 2015-10-06 LAB — CBC
HCT: 41 % (ref 35.0–47.0)
Hemoglobin: 13.5 g/dL (ref 12.0–16.0)
MCH: 28.5 pg (ref 26.0–34.0)
MCHC: 32.8 g/dL (ref 32.0–36.0)
MCV: 86.9 fL (ref 80.0–100.0)
PLATELETS: 240 10*3/uL (ref 150–440)
RBC: 4.72 MIL/uL (ref 3.80–5.20)
RDW: 17.7 % — AB (ref 11.5–14.5)
WBC: 12.9 10*3/uL — AB (ref 3.6–11.0)

## 2015-10-06 LAB — BASIC METABOLIC PANEL
ANION GAP: 12 (ref 5–15)
BUN: 13 mg/dL (ref 6–20)
CALCIUM: 8.2 mg/dL — AB (ref 8.9–10.3)
CO2: 29 mmol/L (ref 22–32)
CREATININE: 0.75 mg/dL (ref 0.44–1.00)
Chloride: 98 mmol/L — ABNORMAL LOW (ref 101–111)
GFR calc Af Amer: >60 mL/min (ref >60)
GLUCOSE: 107 mg/dL — AB (ref 65–99)
Potassium: 3.1 mmol/L — ABNORMAL LOW (ref 3.5–5.1)
Sodium: 139 mmol/L (ref 135–145)

## 2015-10-06 LAB — GLUCOSE, CAPILLARY: Glucose-Capillary: 107 mg/dL — ABNORMAL HIGH (ref 65–99)

## 2015-10-06 LAB — MAGNESIUM: Magnesium: 1.9 mg/dL (ref 1.7–2.4)

## 2015-10-06 LAB — PROTIME-INR
INR: 1.08
Prothrombin Time: 14.2 seconds (ref 11.4–15.0)

## 2015-10-06 MED ORDER — PNEUMOCOCCAL VAC POLYVALENT 25 MCG/0.5ML IJ INJ: 0.5000 mL | INJECTION | INTRAMUSCULAR | Status: DC | PRN

## 2015-10-06 MED ORDER — ONDANSETRON HCL 4 MG/2ML IJ SOLN
4.0000 mg | Freq: Four times a day (QID) | INTRAMUSCULAR | Status: DC | PRN
  Administered 2015-10-07 – 2015-10-08 (×3): 4 mg via INTRAVENOUS
  Filled 2015-10-06 (×2): qty 2

## 2015-10-06 MED ORDER — POTASSIUM CHLORIDE 10 MEQ/50ML IV SOLN
10.0000 meq | INTRAVENOUS | Status: AC
  Administered 2015-10-06 (×4): 10 meq via INTRAVENOUS
  Filled 2015-10-06 (×4): qty 50

## 2015-10-06 MED ORDER — ENOXAPARIN SODIUM 40 MG/0.4ML ~~LOC~~ SOLN: 40.0000 mg | SUBCUTANEOUS | Status: DC

## 2015-10-06 MED ORDER — WARFARIN SODIUM 2.5 MG PO TABS
7.5000 mg | ORAL_TABLET | Freq: Every day | ORAL | Status: DC
  Administered 2015-10-06 – 2015-10-07 (×2): 7.5 mg via ORAL
  Filled 2015-10-06 (×2): qty 3

## 2015-10-06 MED ORDER — METHOCARBAMOL 500 MG PO TABS
1000.0000 mg | ORAL_TABLET | Freq: Four times a day (QID) | ORAL | Status: DC | PRN
  Administered 2015-10-06 – 2015-10-08 (×7): 1000 mg via ORAL
  Filled 2015-10-06 (×7): qty 2

## 2015-10-06 MED ORDER — CETYLPYRIDINIUM CHLORIDE 0.05 % MT LIQD
7.0000 mL | Freq: Two times a day (BID) | OROMUCOSAL | Status: DC
  Administered 2015-10-06 – 2015-10-07 (×4): 7 mL via OROMUCOSAL

## 2015-10-06 MED ORDER — POTASSIUM CHLORIDE 10 MEQ/100ML IV SOLN
10.0000 meq | INTRAVENOUS | Status: DC
  Filled 2015-10-06 (×4): qty 100

## 2015-10-06 MED ORDER — FENTANYL CITRATE (PF) 100 MCG/2ML IJ SOLN: 12.5000 ug | INTRAMUSCULAR | Status: DC | PRN

## 2015-10-06 MED ORDER — ACETAMINOPHEN 325 MG PO TABS
650.0000 mg | ORAL_TABLET | Freq: Four times a day (QID) | ORAL | Status: DC | PRN
Start: 2015-10-06 — End: 2015-10-08
  Administered 2015-10-06 – 2015-10-08 (×4): 650 mg via ORAL
  Filled 2015-10-06 (×4): qty 2

## 2015-10-06 MED ORDER — ENOXAPARIN SODIUM 40 MG/0.4ML ~~LOC~~ SOLN
40.0000 mg | SUBCUTANEOUS | Status: DC
  Administered 2015-10-07: 40 mg via SUBCUTANEOUS
  Filled 2015-10-06: qty 0.4

## 2015-10-06 MED ORDER — POTASSIUM CHLORIDE 20 MEQ PO PACK: 40.0000 meq | PACK | Freq: Once | ORAL | Status: DC

## 2015-10-06 MED ORDER — POLYETHYLENE GLYCOL 3350 17 G PO PACK: 17.0000 g | PACK | Freq: Every day | ORAL | Status: DC | PRN

## 2015-10-06 MED ORDER — CLONIDINE HCL 0.1 MG PO TABS
0.1000 mg | ORAL_TABLET | Freq: Four times a day (QID) | ORAL | Status: DC | PRN
  Administered 2015-10-06 – 2015-10-08 (×6): 0.1 mg via ORAL
  Filled 2015-10-06 (×6): qty 1

## 2015-10-06 MED ORDER — LITHIUM CARBONATE ER 300 MG PO TBCR
300.0000 mg | EXTENDED_RELEASE_TABLET | Freq: Every day | ORAL | Status: DC
  Administered 2015-10-06 – 2015-10-07 (×2): 300 mg via ORAL
  Filled 2015-10-06 (×4): qty 1

## 2015-10-06 MED ORDER — WARFARIN - PHARMACIST DOSING INPATIENT
Freq: Every day | Status: DC
  Administered 2015-10-06 – 2015-10-07 (×2)

## 2015-10-06 MED ORDER — SODIUM CHLORIDE 0.9 % IV SOLN
3.0000 g | Freq: Four times a day (QID) | INTRAVENOUS | Status: DC
  Administered 2015-10-06 – 2015-10-08 (×7): 3 g via INTRAVENOUS
  Filled 2015-10-06 (×10): qty 3

## 2015-10-06 MED ORDER — ONDANSETRON HCL 4 MG/2ML IJ SOLN
4.0000 mg | Freq: Four times a day (QID) | INTRAMUSCULAR | Status: DC | PRN
  Administered 2015-10-06: 4 mg via INTRAVENOUS
  Filled 2015-10-06 (×2): qty 2

## 2015-10-06 MED ORDER — DOCUSATE SODIUM 100 MG PO CAPS
100.0000 mg | ORAL_CAPSULE | Freq: Two times a day (BID) | ORAL | Status: DC
  Administered 2015-10-06 – 2015-10-08 (×4): 100 mg via ORAL
  Filled 2015-10-06 (×4): qty 1

## 2015-10-06 MED ORDER — SENNA 8.6 MG PO TABS
1.0000 | ORAL_TABLET | Freq: Every day | ORAL | Status: DC
  Administered 2015-10-06 – 2015-10-08 (×3): 8.6 mg via ORAL
  Filled 2015-10-06 (×3): qty 1

## 2015-10-06 NOTE — Progress Notes (Signed)
Pharmacy Antibiotic Note  [place] Jettyndrea V Noller is a 40 y.o. female admitted on 10/03/2015 with aspiration pneumonia.  Pharmacy has been consulted for Unasyn dosing.  Plan: Will transition patient from Zosyn to Unasyn 3g IV q6hr for total of 5 days of antibiotic therapy.   Height: 5\' 5"  (165.1 cm) Weight: (!) 311 lb 8.2 oz (141.3 kg) IBW/kg (Calculated) : 57  Temp (24hrs), Avg:99.6 F (37.6 C), Min:98.6 F (37 C), Max:100.3 F (37.9 C)   Recent Labs Lab 10/03/15 1940 10/04/15 0516 10/05/15 0503 10/06/15 0453  WBC 13.4* 12.4* 14.0* 12.9*  CREATININE 0.83 0.68 0.92 0.75    Estimated Creatinine Clearance: 132.5 mL/min (by C-G formula based on Cr of 0.75).    Allergies  Allergen Reactions  . Vancomycin Anaphylaxis    Antimicrobials this admission: Zosyn 5/6 >> 5/8 Unasyn 5/8 >>  Dose adjustments this admission: N/A  Microbiology results:  5/6 MRSA PCR: negative  Pharmacy will continue to monitor and adjust per consult.    Prairie Stenberg L 10/06/2015 10:40 AM

## 2015-10-06 NOTE — Progress Notes (Signed)
PULMONARY / CRITICAL CARE MEDICINE   Name: Courtney Hebert MRN: [id number] DOB: Dec 16, 1975    ADMISSION DATE:  10/03/2015 CONSULTATION DATE: 10/03/2015  REFERRING MD: EDP  CHIEF COMPLAINT: Opiate overdose  SUBJECTIVE:  Patient remains extubated and is on 4l N/C, Maintaining her sats well at this time.  Patient is worried if her husband is mad at her.  VITAL SIGNS: BP 116/69 mmHg  Pulse 90  Temp(Src) 98.6 F (37 C) (Oral)  Resp 23  Ht  (1.651 m)  Wt 141.3 kg (311 lb 8.2 oz)  BMI 51.84 kg/m2  SpO2 98%  LMP  (LMP Unknown)  HEMODYNAMICS:   INTAKE / OUTPUT: I/O last 3 completed shifts: In: 1903.3 [I.V.:1753.3; IV Piggyback:150] Out: 4400 [Urine:4000; Emesis/NG output:400]  PHYSICAL EXAMINATION: General:  Obese, white female, on 4Ln/c, in no acute distress at this time. Neuro: Awake, alert, oriented,no agitation noted at this time HEENT:  Atraumatic, normocephalic, no discharge Cardiovascular:  S1 and S2, regular rate and rhythm, no murmur, gallop or rub Lungs: Clear bilaterally, no wheezes, crackles, rhonchi Abdomen: Obese , rounded, active bowel sounds Musculoskeletal: No inflammation or deformity noted Skin: Grossly intact, track marks present  LABS:  BMET  Recent Labs Lab 10/04/15 0516 10/05/15 0503 10/06/15 0453  NA 138 138 139  K 3.6 3.5 3.1*  CL 97* 99* 98*  CO2 31 32 29  BUN CREATININE 0.68 0.92 0.75  GLUCOSE 131* 119* 107*    Electrolytes  Recent Labs Lab 10/04/15 0516 10/05/15 0503 10/06/15 0453  CALCIUM 8.5* 8.0* 8.2*  MG 2.2  --  1.9  PHOS 3.5  --   --     CBC  Recent Labs Lab 10/04/15 0516 10/05/15 0503 10/06/15 0453  WBC 12.4* 14.0* 12.9*  HGB 13.4 13.3 13.5  HCT 41.9 40.0 41.0  PLT 258 232 240    Coag's No results for input(s): APTT, INR in the last 168 hours.  Sepsis Markers No results for input(s): LATICACIDVEN, PROCALCITON, O2SATVEN in the last 168 hours.  ABG  Recent Labs Lab 10/04/15 0503  10/05/15 1730 10/05/15 2208  PHART 7.43 7.51* 7.52*  PCO2ART 57* 41 43  PO2ART 64* 54* 62*    Liver Enzymes  Recent Labs Lab 10/03/15 1940  AST 29  ALT 22  ALKPHOS 80  BILITOT 0.7  ALBUMIN 3.7    Cardiac Enzymes  Recent Labs Lab 10/03/15 1940  TROPONINI <0.03    Glucose  Recent Labs Lab 10/04/15 0012  GLUCAP 123*    Imaging No results found.   STUDIES:  5/5 CT head without contrast -negative for any acute abnormality 5/5 urine drug screen positive for methadone/benzodiazepines  CULTURES: None  5/5MRSA PCR positive>> ANTIBIOTICS:  5/6 Zosyn>>  SIGNIFICANT EVENTS: 5/5 intubated>5/7 EXTUBATED  LINES/TUBES: 5/5 ET tube>>5/7 EXTUBATED  DISCUSSION: 40 year old female with a history of polysubstance abuse,  admitted with methadone/heroine overdose, intubated on 5/5 ,was extubated on 5/7. Switched to HFNC , and now to Nasal canula , seems to be doing well.  ASSESSMENT / PLAN:  PULMONARY A: Acute hypoxemic respiratory failure- resolved  P:   Extubated on 5/7 Continue O2 as needed Keep Sats > 92% Watch closely for respiratory distress requiring reintubation  CARDIOVASCULAR A:  No active issues P:  Continue telemetry Monitor vital signs  RENAL A:   Hypokalemia P:   Replace electrolytes per ICU protocol Replace K GASTROINTESTINAL A:   No active issues P:   Protonix for GI prophylaxis  HEMATOLOGIC A:  No active issues P:  SCDs Heparin subcutaneous INFECTIOUS A:   Leukocytosis-imroving P:   Monitor fever curve  zosyn changed  To unysyn for a total of 5 days. Day#3 on 5/8   ENDOCRINE A:   No active issues P:   Blood glucose intermittently   NEUROLOGIC A:   Polysubstance abuse tobacco abuse Migraine P:   RASS goal: 0  need psych evaluation    Bincy Varughese,AG-ACNP Pulmonary & Critical Care [phone]   Pt seen and examined with NP, agree with assessment and plan.   Acute respiratory failure, today  the patient is awake and is much more alert, she is answering questions, she is no distress, lung CTA. She has been weaned to 4L [place] from high-flow oxygen.   OK to transfer to floor.  Wells Guiles-Deep Andriea Hasegawa, M.D.  10/06/2015

## 2015-10-06 NOTE — Progress Notes (Signed)
ANTICOAGULATION CONSULT NOTE - Initial Consult  Pharmacy Consult for warfarin dosing  Indication: History of Recurrent DVTs  Allergies  Allergen Reactions  . Vancomycin Anaphylaxis    Patient Measurements: Height: 5\' 5"  (165.1 cm) Weight: (!) 311 lb 8.2 oz (141.3 kg) IBW/kg (Calculated) : 57   Labs:  Recent Labs  10/03/15 1940 10/04/15 0516 10/05/15 0503 10/06/15 0453  HGB 14.2 13.4 13.3 13.5  HCT 44.0 41.9 40.0 41.0  PLT 268 258 232 240  CREATININE 0.83 0.68 0.92 0.75  TROPONINI <0.03  --   --   --     Estimated Creatinine Clearance: 132.5 mL/min (by C-G formula based on Cr of 0.75).    Assessment: Pharmacy consulted to dose warfarin for 40 yo female s/p extubation and overdose with history of recurrent DVTs. Per medication history patient takes warfarin 5mg  daily.    Goal of Therapy:  INR 2-3   Plan:  Patient transitioned from heparin 5000 units Q8hr to enoxaparin 40mg  SQ daily on AM rounds. Per discussion with CCM and hospitalist will defer full dose anticoagulation at this time.  Will initiate patient on warfarin 7.5mg  q1800. Will obtain follow up INR with am labs.   Pharmacy will continue to monitor and adjust per consult.    Simpson,Michael L 10/06/2015,10:48 AM

## 2015-10-06 NOTE — Care Management Note (Signed)
Case Management Note  Patient Details  Name: Courtney Hebert MRN: [id number] Date of Birth: 16-Mar-1976  Subjective/Objective:   Admitted with polysubstance abuse and drug overdose. She came home from work early on Friday with a migraine and overdosed in the bathroom on heroin. Intubated 5/5 and extubated 5/7. PT and SLP consult pending. Psych consult pending. Met with patient and her spouse at bedside. She is awake but speech and alertness is slow. Directed conversation to spouse. Patient continues to work. She will have insurance beginning soon but isn't sure when it will start with her current employer.  Spouse states she has a PCP and was last seen in the past 3 weeks but her spouse or the patient could not recall the name. Patient pays for her medications. They have had a bad experience with medication management so they refuse to go back for additional assistance. [facility] since she has [facility]. Spouse denies issues paying for medications "as long as they are not to expensive".  He denies issues paying for her routine medications before discharge.   Action/Plan: Review discharge medications for cost. No additional needs identified.   Expected Discharge Date:                  Expected Discharge Plan:  Home/Self Care  In-House Referral:     Discharge planning Services  CM Consult, [facility], Medication Assistance  Post Acute Care Choice:    Choice offered to:     DME Arranged:    DME Agency:     HH Arranged:    HH Agency:     Status of Service:  In process, will continue to follow  Medicare Important Message Given:    Date Medicare IM Given:    Medicare IM give by:    Date Additional Medicare IM Given:    Additional Medicare Important Message give by:     If discussed at [place] of Stay Meetings, dates discussed:    Additional Comments:  Jolly Mango, RN 10/06/2015, 11:22 AM

## 2015-10-06 NOTE — Progress Notes (Signed)
Courtney Hebert has been admitted to ICU on 10/03/2015 for overdose with heroin requiring intubation. 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 She was successfully extubated on 10/05/2015 and has remained on high flow nasal cannula. Chest x-ray with bibasilar atelectasis more prominent on the right base. Being started on Unasyn for possible aspiration pneumonia. Currently weaned off to 4 L oxygen. Speech consult is pending for swallow eval. Psych consult is pending prior to restarting her psych medications. Her Coumadin was on hold on admission. Apparently she has history of recurrent DVTs. Coumadin has just been restarted. Check Dopplers to see if she needs to be bridged. INR is pending. Patient has been accepted to hospitalist services.

## 2015-10-06 NOTE — Evaluation (Signed)
Clinical/Bedside Swallow Evaluation Patient Details  Name: Courtney Hebert MRN: [id number] Date of Birth: 1976/03/19  Today's Date: 10/06/2015 Time: SLP Start Time (ACUTE ONLY): 1145 SLP Stop Time (ACUTE ONLY): 1245 SLP Time Calculation (min) (ACUTE ONLY): 60 min  Past Medical History:  Past Medical History  Diagnosis Date  . Chronic back pain    Past Surgical History: History reviewed. No pertinent past surgical history. HPI:  Pt was admitted with polysubstance abuse and drug overdose. She came home from work early on Friday with a migraine and overdosed in the bathroom on heroin. Intubated 5/5 to declined respiratory status, lethargy then extubated 5/7. 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 She lives at home w/ spouse and is employed. Currently, pt sitting in chair at bedside. She was awake, verbally responsive and conversed w/ adult children in room. Speech and movements were slow, speech slighlty mumbled w/ low volume. NSG reported pt exhibited coughing when drinking thin liquids this morning. Pt/family eager to eat/drink.    Assessment / Plan / Recommendation Clinical Impression  Pt presents w/ increased risk for aspiration at this time; suspect related to recent oral intubation(possible irritation as pt pulled at the tube during intubation) and recent medications and overall general weakness. Pt exhibited overt s/s of aspiration w/ trials of thin liquids despite aspiration precautions. When given TSP trials of thickened liquid(Honey consistency) and puree trials, no immediate, overt s/s of aspiration noted. Pt exhibited clear vocal quality b/t trials and no decline in respiratory status or O2 sats noted w/ trials. Min. slower oral movements overall w/ trials, but no significant impact on the oral phase management of the boluses; pt transferred and cleared appropriately. No  trials of solids were done at this session today. Pt appears to benefit from diet modification to a Dys. 1 w/ Honey consistency liquids via TSP w/ strict aspiration precautions; meds in Puree - crushed or in liquid form mixed in puree. Monitoring at all meals for any s/s of aspiration. ST will f/u w/ toleration of diet and trials to upgrade as appropriate. MD updated and agreed.    Aspiration Risk  Moderate aspiration risk    Diet Recommendation  Dysphagia 1 w/ Honey consistency liquids by TSP; strict aspiration precautions; assistance and monitoring w/ all oral intake.   Medication Administration: Crushed with puree or in liquid form mixed in puree   Other  Recommendations Recommended Consults:  (Dietician f/u) Oral Care Recommendations: Oral care BID;Staff/trained caregiver to provide oral care Other Recommendations: Order thickener from pharmacy;Prohibited food (jello, ice cream, thin soups);Remove water pitcher   Follow up Recommendations   (TBD)    Frequency and Duration min 3x week  2 weeks       Prognosis Prognosis for Safe Diet Advancement: Fair (-Good) Barriers to Reach Goals:  (weakness)      Swallow Study   General Date of Onset: 10/03/15 HPI: Pt was admitted with polysubstance abuse and drug overdose. She came home from work early on Friday with a migraine and overdosed in the bathroom on heroin. Intubated 5/5 to declined respiratory status, lethargy then extubated 5/7. 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 She lives at home w/ spouse and is employed. Currently, pt sitting in chair at bedside. She was awake, verbally responsive and conversed w/ adult children in room. Speech  and movements were slow, speech slighlty mumbled w/ low volume. NSG reported pt exhibited coughing when drinking thin liquids this morning. Pt/family eager to eat/drink.  Type of  Study: Bedside Swallow Evaluation Previous Swallow Assessment: none indicated Diet Prior to this Study: NPO (regular at home per pt report) Temperature Spikes Noted: No (wbc 12.9) Respiratory Status: Nasal cannula (4 liters) History of Recent Intubation: Yes Length of Intubations (days): 2 days Date extubated: 10/05/15 Behavior/Cognition: Alert;Cooperative;Pleasant mood;Distractible;Requires cueing (min.) Oral Cavity Assessment: Dry Oral Care Completed by SLP: Recent completion by staff Oral Cavity - Dentition: Adequate natural dentition Vision: Functional for self-feeding Self-Feeding Abilities: Total assist;Needs set up;Needs assist (weakness) Patient Positioning: Upright in chair Baseline Vocal Quality: Low vocal intensity (slighlty mumbled) Volitional Cough: Strong;Congested (min.) Volitional Swallow: Able to elicit    Oral/Motor/Sensory Function Overall Oral Motor/Sensory Function: Within functional limits   Ice Chips Ice chips: Within functional limits Presentation: Spoon (single ice chips at a time - 5 trials) Other Comments: pt had been eating a spoonful at a time per family report   Thin Liquid Thin Liquid: Impaired Presentation: Cup;Self Fed (assisted; x2 trials) Oral Phase Impairments:  (min. slower oral movements overall) Oral Phase Functional Implications:  (no significant) Pharyngeal  Phase Impairments: Throat Clearing - Delayed;Cough - Delayed (both)    Nectar Thick Nectar Thick Liquid: Not tested   Honey Thick Honey Thick Liquid: Within functional limits Presentation: Spoon (fed; 6 trials) Other Comments: had a congested cough x1 w/ phlegm production whcih did not appear related to the bolus trials; pt agreed.   Puree Puree: Within functional limits Presentation: Spoon (fed; 8 trials) Other Comments: min. slower oral movements overall but no significant impact on the oral phase management of the boluses; pt cleared appropriately   Solid   GO   Solid: Not tested        Jerilynn SomKatherine Daniella Dewberry, MS, CCC-SLP  Courtney Hebert 10/06/2015,2:44 PM

## 2015-10-06 NOTE — Progress Notes (Signed)
Physical Therapy Evaluation Patient Details Name: Courtney Hebert MRN: [id number] DOB: 08/11/1975 Today's Date: 10/06/2015   History of Present Illness  Courtney Hebert, Courtney Hebert is a 40 year old female with past medical history significant for chronic back pain, tobacco abuse, polysubstance abuse, migraine. On 5/5 patient was brought to [facility] unresponsive. According to emergency medical services the patient came home early from work today due to migraine. She shot herself with heroine in the bathroom. At some point her children and the landlord became concerned. They attempted to open the bathroom door and found the patient on the floor. EMS was called, upon arrival EMS found the patient to be lethargic and unable to answer question. They gave 2 mg of intranasal Narcan which had some positive effect on the Route to the hospital. Patient continued to be lethargic and somnolent and combative upon arrival. Patient continued to be lethargic and somnolent, her oxygen saturation dropped to 68% requiring intubation and mechanical ventilation. [facility] M team was consulted on 5/5 for admission/further management.  Clinical Impression  Pt presents to PT this date with lethargy, decreased reaction times, weakness, and impaired functional mobility.  Pt required Mod A to transfer from supine to sit and Min A for ambulation next to bed and for bed to chair transfer.  Pt c/o pain in feet during standing that was relieved once seated. Pt currently on 4 L O2, VSS throughout session.  Pt would benefit from acute PT services to address objective findings.    Follow Up Recommendations Home health PT    Equipment Recommendations  Rolling walker with 5" wheels    Recommendations for Other Services       Precautions / Restrictions Precautions Precautions: Fall (contact isolation MRSA) Precaution Comments: Mod Restrictions Weight Bearing Restrictions: No      Mobility  Bed Mobility Overal bed mobility:  Needs Assistance Bed Mobility: Supine to Sit     Supine to sit: Mod assist     General bed mobility comments: HOB flat, Mod A to elevate trunk, once sitting able to rotate legs/hip to EOB  Transfers Overall transfer level: Needs assistance Equipment used: Rolling walker (2 wheeled) Transfers: Sit to/from [employer] Sit to Stand: Min assist Stand pivot transfers: Min assist       General transfer comment: Pushing up with B hands on supported RW  Ambulation/Gait Ambulation/Gait assistance: Min assist Ambulation Distance (Feet): 3 Feet Assistive device: Rolling walker (2 wheeled) Gait Pattern/deviations:  (side steps)     General Gait Details: Pt able to take about 5 side steps with RW along length of bed; decreased weight shifting and heavy lean on RW; also took several steps from bed to recliner with Min A for sequencing and safety.  Stairs            Wheelchair Mobility    Modified Rankin (Stroke Patients Only)       Balance Overall balance assessment: Needs assistance Sitting-balance support: Feet supported;Bilateral upper extremity supported Sitting balance-Leahy Scale: Good     Standing balance support: Bilateral upper extremity supported;During functional activity Standing balance-Leahy Scale: Fair                               Pertinent Vitals/Pain Pain Assessment: 0-10 Pain Location: foot pain with standing Pain Intervention(s): Limited activity within patient's tolerance    Home Living Family/patient expects to be discharged to:: Private residence Living Arrangements: Spouse/significant other;Children Available Help at  Discharge: Family Type of Home: House Home Access: Stairs to enter Entrance Stairs-Rails: None Entrance Stairs-Number of Steps: 4 Home Layout: One level        Prior Function Level of Independence: Independent         Comments: Works outside of the home.     Hand Dominance         Extremity/Trunk Assessment   Upper Extremity Assessment: Overall WFL for tasks assessed           Lower Extremity Assessment: Overall WFL for tasks assessed         Communication   Communication: Other (comment) (slow, slurred speech)  Cognition Arousal/Alertness: Lethargic Behavior During Therapy: WFL for tasks assessed/performed Overall Cognitive Status: Impaired/Different from baseline Area of Impairment: Following commands;Memory;Attention   Current Attention Level: Sustained Memory: Decreased short-term memory Following Commands: Follows one step commands consistently       General Comments: Overall slow verbal and physical responses    General Comments General comments (skin integrity, edema, etc.): edema B hands    Exercises Other Exercises Other Exercises: general LE exercises in chair including AP, heel slides, and LAQ.      Assessment/Plan    PT Assessment Patient needs continued PT services  PT Diagnosis Difficulty walking;Generalized weakness;Acute pain   PT Problem List Decreased strength;Decreased activity tolerance;Decreased balance;Decreased cognition;Decreased knowledge of use of DME;Decreased safety awareness;Pain  PT Treatment Interventions DME instruction;Gait training;Stair training;Functional mobility training;Therapeutic activities;Balance training;Patient/family education   PT Goals (Current goals can be found in the Care Plan section) Acute Rehab PT Goals Patient Stated Goal: To go home. PT Goal Formulation: With patient Time For Goal Achievement: 10/20/15    Frequency Min 2X/week   Barriers to discharge        Co-evaluation               End of Session Equipment Utilized During Treatment: Gait belt;Oxygen (4 L) Activity Tolerance: Patient tolerated treatment well;Patient limited by lethargy Patient left: in chair;with nursing/sitter in room;with family/visitor present Nurse Communication: Mobility status          Time: [id number] PT Time Calculation (min) (ACUTE ONLY): 32 min   Charges:   PT Evaluation $PT Eval Moderate Complexity: 1 Procedure PT Treatments $Therapeutic Exercise: 8-22 mins   PT G Codes:        Courtney Hebert, PT 10/06/2015, 10:46 AM

## 2015-10-06 NOTE — Progress Notes (Signed)
eLink Physician-Brief Progress Note Patient Name: Courtney Hebert DOB: [birth year] MRN: [id number]   Date of Service  10/06/2015  HPI/Events of Note  F/u on pt as she was extubated on 5/7  eICU Interventions  Pt looks comfortable, on [place] 40%. 113/40. 86. 20-30. 97%.  Cont present management.         Yaritsa Savarino [address]Angelo A De Dios 10/06/2015, 3:24 AM

## 2015-10-06 NOTE — Progress Notes (Signed)
Nutrition Follow-up       INTERVENTION:  -SLP evaluation ordered this am. Await diet tolerance.  -Will add magic cup BID for added nutrition   NUTRITION DIAGNOSIS:   Inadequate oral intake related to acute illness as evidenced by  (s/p extubation).    GOAL:   Patient will meet greater than or equal to 90% of their needs    MONITOR:   PO intake, Supplement acceptance  REASON FOR ASSESSMENT:   Ventilator    ASSESSMENT:   40 y/o female admitted with overdose, on vent.     Pt extubated on 5/7.  Medications and labs reviewed  Diet Order:  DIET - DYS 1 Room service appropriate?: Yes with Assist; Fluid consistency:: Honey Thick  Skin:  Reviewed, no issues  Last BM:  PTA  Height:   Ht Readings from Last 1 Encounters:  10/04/15 5\' 5"  (1.651 m)    Weight:   Wt Readings from Last 1 Encounters:  10/06/15 311 lb 8.2 oz (141.3 kg)    Ideal Body Weight:     BMI:  Body mass index is 51.84 kg/(m^2).  Estimated Nutritional Needs:   Kcal:  1710-1995 kcals   Protein:  85-100 g/d  Fluid:  1.7-2 L/d  EDUCATION NEEDS:   No education needs identified at this time  Nastashia Gallo B. Freida BusmanAllen, RD, LDN [phone] (pager) Weekend/On-Call pager [phone])

## 2015-10-06 NOTE — Consult Note (Signed)
[facility] Face-to-Face Psychiatry Consult   Reason for Consult:  Consult for 40-year-old woman brought into the hospital after overdosing on drugs at home. Concern about drug abuse and mood Referring Physician:  Kalisetti Patient Identification: Courtney Hebert MRN:  [id number] Principal Diagnosis: Methadone overdose Diagnosis:   Patient Active Problem List   Diagnosis Date Noted  . Bipolar disorder (HCC) [F31.9] 10/06/2015  . Opiate dependence (HCC) [F11.20] 10/06/2015  . Methadone overdose [T40.3X4A] 10/03/2015  . Polysubstance abuse [F19.10] 10/03/2015  . Acute hypoxemic respiratory failure (HCC) [J96.01] 10/03/2015  . Tobacco abuse [Z72.0] 10/03/2015  . Migraine [G43.909] 10/03/2015    Total Time spent with patient: 1 hour  Subjective:   Courtney Hebert is a 40 y.o. female patient admitted with "well I'm feeling pretty bad, I'm in withdrawal".  HPI:  40-year-old woman brought to the hospital after family found her unconscious at home. Patient had overdosed on intravenous drugs. She required treatment in the critical care unit and has now been transferred out to the floor. Patient admits that she shot up with synthetic heroin that she was able to get on line. She says that this is something she had been planning for a while. He was her first relapse in years. She doesn't have any particular justification for it but does report that she's been under a lot of stress recently. Relationship with her husband has been strained. As been extra tension in the home since her daughter moved back in. Patient had been stable in methadone maintenance for several years going to the [place] methadone clinic. She denies that she used any alcohol. Does admit that she took some Xanax around the same time as well. Patient denies having any intention to kill her self. Mood had been anxious and irritable but not extremely depressed. No suicidal ideation no psychosis. She had been compliant with her prescription lithium  which had been her stable psychiatric medicine.  Substance abuse history: Long-standing history of substance abuse primarily opiate dependence. Had been going to the methadone clinic in [place] for years and had been stable on a dose of 150 mg a day of methadone.  Medical history: Patient is overweight. Has a history of blood clots and is on anticoagulant medication. 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 She is married. Has an adult daughter now living back at home.  Past Psychiatric History: Past history of treatment for bipolar disorder. Has had hospitalizations multiple times in the past but the most recent one was 2009. Does have a past history of suicidality and aggression. Was stabilized on lithium in 2009 and has been doing well on that ever since. Doesn't have a local psychiatric provider but gets treatment through her primary care doctor. Patient has a history of opiate dependence and has been stable at the local methadone clinic. Reports she had tried other antidepressants in the past with poor outcomes.  Risk to Self: Is patient at risk for suicide?: No Risk to Others:   Prior Inpatient Therapy:   Prior Outpatient Therapy:    Past Medical History:  Past Medical History  Diagnosis Date  . Chronic back pain    History reviewed. No pertinent past surgical history. Family History: No family history on file. Family Psychiatric  History: Bipolar disorder in the mother and alcohol dependence in both parents Social History:  History  Alcohol Use  . Yes     History    Drug Use  . Yes  . Special: IV    Comment: heroin    Social History   Social History  . Marital Status: Married    Spouse Name: N/A  . Number of Children: N/A  . Years of Education: N/A   Social History Main Topics  . Smoking status: Current Every Day Smoker  . Smokeless tobacco: None   . Alcohol Use: Yes  . Drug Use: Yes    Special: IV     Comment: heroin  . Sexual Activity: Not Asked   Other Topics Concern  . None   Social History Narrative   Additional Social History:    Allergies:   Allergies  Allergen Reactions  . Vancomycin Anaphylaxis    Labs:  Results for orders placed or performed during the hospital encounter of 10/03/15 (from the past 48 hour(s))  CBC with Differential/Platelet     Status: Abnormal   Collection Time: 10/05/15  5:03 AM  Result Value Ref Range   WBC 14.0 (H) 3.6 - 11.0 K/uL   RBC 4.65 3.80 - 5.20 MIL/uL   Hemoglobin 13.3 12.0 - 16.0 g/dL   HCT 40.0 35.0 - 47.0 %   MCV 86.0 80.0 - 100.0 fL   MCH 28.5 26.0 - 34.0 pg   MCHC 33.1 32.0 - 36.0 g/dL   RDW 17.3 (H) 11.5 - 14.5 %   Platelets 232 150 - 440 K/uL   Neutrophils Relative % 85% %   Neutro Abs 11.9 (H) 1.4 - 6.5 K/uL   Lymphocytes Relative 9% %   Lymphs Abs 1.2 1.0 - 3.6 K/uL   Monocytes Relative 5% %   Monocytes Absolute 0.7 0.2 - 0.9 K/uL   Eosinophils Relative 1% %   Eosinophils Absolute 0.2 0 - 0.7 K/uL   Basophils Relative 0% %   Basophils Absolute 0.0 0 - 0.1 K/uL  Basic metabolic panel     Status: Abnormal   Collection Time: 10/05/15  5:03 AM  Result Value Ref Range   Sodium 138 135 - 145 mmol/L   Potassium 3.5 3.5 - 5.1 mmol/L   Chloride 99 (L) 101 - 111 mmol/L   CO2 32 22 - 32 mmol/L   Glucose, Bld 119 (H) 65 - 99 mg/dL   BUN 12 6 - 20 mg/dL   Creatinine, Ser 0.92 0.44 - 1.00 mg/dL   Calcium 8.0 (L) 8.9 - 10.3 mg/dL   GFR calc non Af Amer >60 >60 mL/min   GFR calc Af Amer >60 >60 mL/min    Comment: (NOTE) The eGFR has been calculated using the CKD EPI equation. This calculation has not been validated in all clinical situations. eGFR's persistently <60 mL/min signify possible Chronic Kidney Disease.    Anion gap 7 5 - 15  Blood gas, arterial     Status: Abnormal (Preliminary result)   Collection Time: 10/05/15  5:30 PM  Result Value Ref Range    FIO2 0.32    Delivery systems NASAL CANNULA    Inspiratory PAP PENDING    Expiratory PAP PENDING    pH, Arterial 7.51 (H) 7.350 - 7.450   pCO2 arterial 41 32.0 - 48.0 mmHg   pO2, Arterial 54 (L) 83.0 - 108.0 mmHg   Bicarbonate 32.7 (H) 21.0 - 28.0 mEq/L   Acid-Base Excess 8.8 (H) 0.0 - 3.0 mmol/L   O2 Saturation 90.8 %   Patient temperature 37.0    Oxygen index PENDING    Collection site RIGHT RADIAL    Sample  type ARTERIAL DRAW    Allens test (pass/fail) POSITIVE (A) PASS  Blood gas, arterial     Status: Abnormal (Preliminary result)   Collection Time: 10/05/15 10:08 PM  Result Value Ref Range   FIO2 0.40    Delivery systems HI FLOW NASAL CANNULA    pH, Arterial 7.52 (H) 7.350 - 7.450   pCO2 arterial 43 32.0 - 48.0 mmHg   pO2, Arterial 62 (L) 83.0 - 108.0 mmHg   Bicarbonate 35.1 (H) 21.0 - 28.0 mEq/L   Acid-Base Excess 10.9 (H) 0.0 - 3.0 mmol/L   O2 Saturation 93.8 %   Patient temperature 37.0    Collection site RIGHT BRACHIAL    Sample type ARTERIAL DRAW    Allens test (pass/fail) PENDING PASS   Mechanical Rate PENDING   CBC     Status: Abnormal   Collection Time: 10/06/15  4:53 AM  Result Value Ref Range   WBC 12.9 (H) 3.6 - 11.0 K/uL   RBC 4.72 3.80 - 5.20 MIL/uL   Hemoglobin 13.5 12.0 - 16.0 g/dL   HCT 41.0 35.0 - 47.0 %   MCV 86.9 80.0 - 100.0 fL   MCH 28.5 26.0 - 34.0 pg   MCHC 32.8 32.0 - 36.0 g/dL   RDW 17.7 (H) 11.5 - 14.5 %   Platelets 240 150 - 440 K/uL  Basic metabolic panel     Status: Abnormal   Collection Time: 10/06/15  4:53 AM  Result Value Ref Range   Sodium 139 135 - 145 mmol/L   Potassium 3.1 (L) 3.5 - 5.1 mmol/L   Chloride 98 (L) 101 - 111 mmol/L   CO2 29 22 - 32 mmol/L   Glucose, Bld 107 (H) 65 - 99 mg/dL   BUN 13 6 - 20 mg/dL   Creatinine, Ser 0.75 0.44 - 1.00 mg/dL   Calcium 8.2 (L) 8.9 - 10.3 mg/dL   GFR calc non Af Amer >60 >60 mL/min   GFR calc Af Amer >60 >60 mL/min    Comment: (NOTE) The eGFR has been calculated using the CKD EPI  equation. This calculation has not been validated in all clinical situations. eGFR's persistently <60 mL/min signify possible Chronic Kidney Disease.    Anion gap 12 5 - 15  Magnesium     Status: None   Collection Time: 10/06/15  4:53 AM  Result Value Ref Range   Magnesium 1.9 1.7 - 2.4 mg/dL  Glucose, capillary     Status: Abnormal   Collection Time: 10/06/15 11:39 AM  Result Value Ref Range   Glucose-Capillary 107 (H) 65 - 99 mg/dL  Protime-INR     Status: None   Collection Time: 10/06/15  2:06 PM  Result Value Ref Range   Prothrombin Time 14.2 11.4 - 15.0 seconds   INR 1.08     Current Facility-Administered Medications  Medication Dose Route Frequency Provider Last Rate Last Dose  . 0.9 %  sodium chloride infusion  250 mL Intravenous PRN Bincy S Varughese, NP      . acetaminophen (TYLENOL) tablet 650 mg  650 mg Oral Q6H PRN Radhika Kalisetti, MD   650 mg at 10/06/15 1529  . Ampicillin-Sulbactam (UNASYN) 3 g in sodium chloride 0.9 % 100 mL IVPB  3 g Intravenous Q6H Kurian Kasa, MD   3 g at 10/06/15 1808  . antiseptic oral rinse (CPC / CETYLPYRIDINIUM CHLORIDE 0.05%) solution 7 mL  7 mL Mouth Rinse BID Kurian Kasa, MD   7 mL at 10/06/15 1100  .   Chlorhexidine Gluconate Cloth 2 % PADS 6 each  6 each Topical Q0600 Bincy S Varughese, NP   6 each at 10/06/15 0549  . cloNIDine (CATAPRES) tablet 0.1 mg  0.1 mg Oral Q6H PRN John T Clapacs, MD      . docusate sodium (COLACE) capsule 100 mg  100 mg Oral BID Radhika Kalisetti, MD   100 mg at 10/06/15 1806  . [START ON 10/07/2015] enoxaparin (LOVENOX) injection 40 mg  40 mg Subcutaneous Q24H Kurian Kasa, MD      . lithium carbonate (LITHOBID) CR tablet 300 mg  300 mg Oral QHS John T Clapacs, MD      . methocarbamol (ROBAXIN) tablet 1,000 mg  1,000 mg Oral Q6H PRN John T Clapacs, MD      . midazolam (VERSED) injection 1-2 mg  1-2 mg Intravenous Q2H PRN Bincy S Varughese, NP   2 mg at 10/05/15 1931  . mupirocin ointment (BACTROBAN) 2 % 1  application  1 application Nasal BID Bincy S Varughese, NP   1 application at 10/06/15 1105  . ondansetron (ZOFRAN) injection 4 mg  4 mg Intravenous Q6H PRN Radhika Kalisetti, MD   4 mg at 10/06/15 1220  . ondansetron (ZOFRAN) injection 4 mg  4 mg Intravenous Q6H PRN John T Clapacs, MD      . pneumococcal 23 valent vaccine (PNU-IMMUNE) injection 0.5 mL  0.5 mL Intramuscular Prior to discharge Pradeep Ramachandran, MD      . polyethylene glycol (MIRALAX / GLYCOLAX) packet 17 g  17 g Oral Daily PRN Radhika Kalisetti, MD      . senna (SENOKOT) tablet 8.6 mg  1 tablet Oral Daily Radhika Kalisetti, MD   8.6 mg at 10/06/15 1806  . warfarin (COUMADIN) tablet 7.5 mg  7.5 mg Oral q1800 Kurian Kasa, MD   7.5 mg at 10/06/15 1806  . Warfarin - Pharmacist Dosing Inpatient   Does not apply q1800 Kurian Kasa, MD      . ziprasidone (GEODON) injection 20 mg  20 mg Intramuscular BID PRN Kurian Kasa, MD   20 mg at 10/05/15 1645    Musculoskeletal: Strength & Muscle Tone: decreased Gait & Station: unable to stand Patient leans: N/A  Psychiatric Specialty Exam: Review of Systems  Constitutional: Positive for malaise/fatigue.  HENT: Negative.   Eyes: Negative.   Respiratory: Negative.   Cardiovascular: Negative.   Gastrointestinal: Positive for abdominal pain and diarrhea.  Musculoskeletal: Positive for myalgias and back pain.  Skin: Negative.   Neurological: Positive for weakness.  Psychiatric/Behavioral: Positive for depression and substance abuse. Negative for suicidal ideas, hallucinations and memory loss. The patient is nervous/anxious and has insomnia.     Blood pressure 136/66, pulse 96, temperature 98.9 F (37.2 C), temperature source Oral, resp. rate 24, height 5' 5" (1.651 m), weight 141.3 kg (311 lb 8.2 oz), SpO2 96 %.Body mass index is 51.84 kg/(m^2).  General Appearance: Fairly Groomed  Eye Contact::  Minimal  Speech:  Slow  Volume:  Normal  Mood:  Dysphoric  Affect:  Congruent  Thought  Process:  Goal Directed  Orientation:  Full (Time, Place, and Person)  Thought Content:  Negative  Suicidal Thoughts:  No  Homicidal Thoughts:  No  Memory:  Immediate;   Good Recent;   Fair Remote;   Fair  Judgement:  Fair  Insight:  Fair  Psychomotor Activity:  Decreased  Concentration:  Fair  Recall:  Fair  Fund of Knowledge:Fair  Language: Fair  Akathisia:  No  Handed:    Right  AIMS (if indicated):     Assets:  Communication Skills Desire for Improvement Financial Resources/Insurance Housing Resilience  ADL's:  Intact  Cognition: WNL  Sleep:      Treatment Plan Summary: Daily contact with patient to assess and evaluate symptoms and progress in treatment, Medication management and Plan Patient with a history of bipolar disorder who had been stable on lithium at a dose apparently of 300 mg a day. Quite a low dose but it had been stable for years. Her kidney function appears to be fine. I'm restarting the lithium at 300 mg for her mood stability. As far as the opiate withdrawal she tells me that her husband is adamant that she not continue with methadone maintenance. Although she and I agree that that's really not necessarily the best decision it's what she is going to follow through with for now. Won't start therefore any narcotics but instead I have added orders for Robaxin, Zofran, clonidine for opiate withdrawal. Patient denied discussed Subutex as possible longer term option but that would have to be after she is in the hospital. I will continue to follow-up as needed in the hospital.  Disposition: Patient does not meet criteria for psychiatric inpatient admission. Supportive therapy provided about ongoing stressors.  John Clapacs, MD 10/06/2015 8:25 PM  

## 2015-10-07 DIAGNOSIS — F3132 Bipolar disorder, current episode depressed, moderate: Secondary | ICD-10-CM | POA: Insufficient documentation

## 2015-10-07 LAB — CBC
HEMATOCRIT: 41.1 % (ref 35.0–47.0)
HEMOGLOBIN: 13.5 g/dL (ref 12.0–16.0)
MCH: 28.6 pg (ref 26.0–34.0)
MCHC: 32.9 g/dL (ref 32.0–36.0)
MCV: 86.8 fL (ref 80.0–100.0)
Platelets: 243 10*3/uL (ref 150–440)
RBC: 4.73 MIL/uL (ref 3.80–5.20)
RDW: 17.4 % — AB (ref 11.5–14.5)
WBC: 10.6 10*3/uL (ref 3.6–11.0)

## 2015-10-07 LAB — BASIC METABOLIC PANEL
ANION GAP: 13 (ref 5–15)
BUN: 14 mg/dL (ref 6–20)
CO2: 25 mmol/L (ref 22–32)
Calcium: 8.5 mg/dL — ABNORMAL LOW (ref 8.9–10.3)
Chloride: 102 mmol/L (ref 101–111)
Creatinine, Ser: 0.75 mg/dL (ref 0.44–1.00)
Glucose, Bld: 114 mg/dL — ABNORMAL HIGH (ref 65–99)
POTASSIUM: 3.6 mmol/L (ref 3.5–5.1)
Sodium: 140 mmol/L (ref 135–145)

## 2015-10-07 LAB — PROTIME-INR
INR: 1.03
Prothrombin Time: 13.7 seconds (ref 11.4–15.0)

## 2015-10-07 MED ORDER — NICOTINE 21 MG/24HR TD PT24
21.0000 mg | MEDICATED_PATCH | Freq: Every day | TRANSDERMAL | Status: DC
  Administered 2015-10-07 – 2015-10-08 (×2): 21 mg via TRANSDERMAL
  Filled 2015-10-07 (×2): qty 1

## 2015-10-07 NOTE — Plan of Care (Signed)
Problem: SLP Dysphagia Goals Goal: Misc Dysphagia Goal Pt will safely tolerate po diet of least restrictive consistency w/ no overt s/s of aspiration noted by Staff/pt/family x3 sessions.    

## 2015-10-07 NOTE — Progress Notes (Signed)
Physical Therapy Treatment Patient Details Name: Courtney Hebert MRN: [id number] DOB: Feb 24, 1976 Today's Date: 10/07/2015    History of Present Illness Courtney Hebert, Courtney Hebert is a 40 year old female with past medical history significant for chronic back pain, tobacco abuse, polysubstance abuse, migraine. On 5/5 patient was brought to [facility] unresponsive. According to emergency medical services the patient came home early from work today due to migraine. She shot herself with heroine in the bathroom. At some point her children and the landlord became concerned. They attempted to open the bathroom door and found the patient on the floor. EMS was called, upon arrival EMS found the patient to be lethargic and unable to answer question. They gave 2 mg of intranasal Narcan which had some positive effect on the Route to the hospital. Patient continued to be lethargic and somnolent and combative upon arrival. Patient continued to be lethargic and somnolent, her oxygen saturation dropped to 68% requiring intubation and mechanical ventilation. [facility] team was consulted on 5/5 for admission/further management.   PT Comments    Pt transferred from ICU to floor since last visit and has been tolerating OOB activity from bed to [facility] with family.  Pt able to ambulate 200' with HHA on 2L O2.  Pt tested on room air prior to ambulation and O2 sats at 90-91%, therefore O2 used during exertion.  Discharge plan updated this session, see recs below.    Follow Up Recommendations  No PT follow up     Equipment Recommendations  None recommended by PT    Recommendations for Other Services       Precautions / Restrictions Precautions Precautions: Fall Precaution Comments: contact isolation Restrictions Weight Bearing Restrictions: No    Mobility  Bed Mobility Overal bed mobility: Needs Assistance Bed Mobility: Supine to Sit     Supine to sit: Min assist     General bed mobility comments: Min A  to elevate trunk off bed  Transfers Overall transfer level: Needs assistance Equipment used: None Transfers: Sit to/from Stand Sit to Stand: Min guard         General transfer comment: Assist for lift off from bed, otherwise has good body mechanics  Ambulation/Gait Ambulation/Gait assistance: Min guard Ambulation Distance (Feet): 200 Feet Assistive device: 1 person hand held assist Gait Pattern/deviations: WFL(Within Functional Limits)   Gait velocity interpretation: Below normal speed for age/gender General Gait Details: Pt amb with slow but steady gait with no gross balnce or gait deviations; pt endoreses LE fatigue at end of gait   Stairs            Wheelchair Mobility    Modified Rankin (Stroke Patients Only)       Balance Overall balance assessment: Needs assistance Sitting-balance support: Feet supported Sitting balance-Leahy Scale: Normal     Standing balance support: No upper extremity supported;During functional activity Standing balance-Leahy Scale: Good                      Cognition Arousal/Alertness: Awake/alert Behavior During Therapy: WFL for tasks assessed/performed Overall Cognitive Status: Within Functional Limits for tasks assessed     Current Attention Level: Focused   Following Commands: Follows multi-step commands consistently       General Comments: Attending to all tasks appropriately with WNL response times.    Exercises Other Exercises Other Exercises: Ambulation 200' without device; sitting and standing balance activities during functional mobility; transfers from bed to chair; Pt education on progression of PT services  and importance of ambulation for pulmonary hygeine and weaning O@    General Comments        Pertinent Vitals/Pain Pain Assessment: No/denies pain    Home Living                      Prior Function            PT Goals (current goals can now be found in the care plan section)  Acute Rehab PT Goals Patient Stated Goal: To go home. PT Goal Formulation: With patient Time For Goal Achievement: 10/20/15    Frequency  Min 2X/week    PT Plan Discharge plan needs to be updated    Co-evaluation             End of Session Equipment Utilized During Treatment: Gait belt;Oxygen Activity Tolerance: Patient tolerated treatment well Patient left: in chair;with call bell/phone within reach;with family/visitor present     Time: [id number] PT Time Calculation (min) (ACUTE ONLY): 23 min  Charges:  $Therapeutic Exercise: 8-22 mins $Therapeutic Activity: 8-22 mins                    G Codes:      Ulis Kaps A Albeiro Trompeter, PT 10/07/2015, 12:37 PM

## 2015-10-07 NOTE — Progress Notes (Cosign Needed)
Speech Language Pathology Treatment: Dysphagia  Patient Details Name: Courtney Hebert MRN: [id number] DOB: 03/12/1976 Today's Date: 10/07/2015 Time: [id number] SLP Time Calculation (min) (ACUTE ONLY): 45 min  Assessment / Plan / Recommendation Clinical Impression  Pt appears to be able to tolerate trials of mech soft foods w/ thin liquids via Cup w/ no overt s/s of aspiration noted during the trials. Pt fed self w/ min. Setup assistance. She consumed trials w/ clear vocal quality post and no decline in respiratory status; no coughing as previously. Pt appeared much improved in strength and vocal quality since eval yesterday. Time was spent w/ pt and husband on education of general aspiration precautions and importance of sitting fully upright to eat/drink such as in the chair; using a puree food such as applesauce to swallows pills at this time during healing post oral extubation. In order to lessen risk for aspiration. Pt/husband agreed.  Recommend diet upgrade to a Clinical biochemistmech soft/regular (husband stated he will monitor diet for soft foods and cut small for pt); medications Whole in puree for safer, easier swallowing at this time post extubation. ST will be available for any further education as needed while admitted. Pt/husband agreed; NSG updated.     HPI HPI: Pt was admitted with polysubstance abuse and drug overdose. She came home from work early on Friday with a migraine and overdosed in the bathroom on heroin. Intubated 5/5 to declined respiratory status, lethargy then extubated 5/7. 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 She lives at home w/ spouse and is employed. Currently, pt reclined in bed w/ husband in room. She was awake, verbally responsive and conversed w/ SLP and husband. Speech and overall movements were less slow, vocal quality improved in strength and volume. NSG reported  pt exhibited coughing when swallowing pills w/ thin liquids this morning. Pt stated she drank water later night and "did fine". Pt/family eager to eat/drink a regular diet.      SLP Plan  Continue with current plan of care     Recommendations  Diet recommendations: Regular;Thin liquid Liquids provided via: Cup Medication Administration: Whole meds with puree (as needed) Supervision: Patient able to self feed;Intermittent supervision to cue for compensatory strategies Compensations: Minimize environmental distractions;Slow rate;Small sips/bites;Follow solids with liquid Postural Changes and/or Swallow Maneuvers: Seated upright 90 degrees;Out of bed for meals (sitting in chair best for positioning)             Oral Care Recommendations: Oral care BID;Staff/trained caregiver to provide oral care Follow up Recommendations: None Plan: Continue with current plan of care     GO               Jerilynn SomKatherine Watson, MS, CCC-SLP  Watson,Katherine 10/07/2015, 11:01 AM

## 2015-10-07 NOTE — Discharge Instructions (Signed)

## 2015-10-07 NOTE — Progress Notes (Signed)
Nutrition Follow-up  DOCUMENTATION CODES:   Morbid obesity  INTERVENTION:   -Cater to pt preferences on Regular diet on thin liquids; SLP following, diet advanced this morning -RD will remove Magic Cup as pt does not like -Will recommend other supplementation if intake inadequate on follow-up   NUTRITION DIAGNOSIS:   Inadequate oral intake related to acute illness as evidenced by  (s/p extubation).  GOAL:   Patient will meet greater than or equal to 90% of their needs  MONITOR:   PO intake, Supplement acceptance  REASON FOR ASSESSMENT:   Ventilator    ASSESSMENT:   [age] y/o female admitted with overdose, on vent.    Pt s/p extubation, moved from CCU to floor yesterday. Pt remains on isolation. Psych following.  Diet Order:  Diet regular Room service appropriate?: Yes; Fluid consistency:: Thin    Current Nutrition: Pt ate egg whites and 25% of Magic Cup this morning. Pt drank some of honey thick liquids. Pt s/p evaluation with SLP this morning in which she ate a few bites of crackers and a Sprite. Husband sent children at bedside to get pt some coffee. Pt reports ordering fish with vegetables for lunch and meatloaf with potatoes for dinner. Pt reports sore throat improving.  RD asked pt how her appetite was doing PTA, pt reports it was fluctuating secondary to social/enviornmental issues.   Gastrointestinal Profile: Last BM: 10/07/2015 RD notes BM was bloody per Nsg documentation  Medications: Colace, Senna, Coumadin Labs: Glucose 114   Weight Trend since Admission: Filed Weights   10/05/15 0400 10/06/15 0316 10/07/15 0337  Weight: 318 lb 9 oz (144.5 kg) 311 lb 8.2 oz (141.3 kg) 311 lb 2 oz (141.125 kg)     Skin:  Reviewed, no issues   BMI:  Body mass index is 51.77 kg/(m^2).  Estimated Nutritional Needs:   Kcal:  1710-1995 kcals   Protein:  85-100 g/d  Fluid:  1.7-2 L/d  EDUCATION NEEDS:   No education needs identified at this time  Leda QuailAllyson Afiya Ferrebee,  RD, LDN Pager [phone] Weekend/On-Call Pager [phone]

## 2015-10-07 NOTE — Progress Notes (Signed)
ANTICOAGULATION CONSULT NOTE - Initial Consult  Pharmacy Consult for warfarin dosing  Indication: History of Recurrent DVTs  Allergies  Allergen Reactions  . Vancomycin Anaphylaxis    Patient Measurements: Height: 5\' 5"  (165.1 cm) Weight: (!) 311 lb 2 oz (141.125 kg) IBW/kg (Calculated) : 57   Labs:  Recent Labs  10/05/15 0503 10/06/15 0453 10/06/15 1406 10/07/15 0431  HGB 13.3 13.5  --  13.5  HCT 40.0 41.0  --  41.1  PLT 232 240  --  243  LABPROT  --   --  14.2 13.7  INR  --   --  1.08 1.03  CREATININE 0.92 0.75  --  0.75    Estimated Creatinine Clearance: 132.4 mL/min (by C-G formula based on Cr of 0.75).    Assessment: Pharmacy consulted to dose warfarin for 40 yo female s/p extubation and overdose with history of recurrent DVTs. Per medication history patient takes warfarin 5mg  daily.    4/8 INR 1.09 4/9 INR 1.03  Goal of Therapy:  INR 2-3   Plan:  Patient transitioned from heparin 5000 units Q8hr to enoxaparin 40mg  SQ daily on AM rounds. Per discussion with CCM and hospitalist will defer full dose anticoagulation at this time.  INR =1.03   Will give warfarin 7.5mg  tonight. Recheck INR in the AM.  Pharmacy will continue to monitor and adjust per consult.    Bianco Cange D Bianca Raneri 10/07/2015,7:37 AM

## 2015-10-07 NOTE — Progress Notes (Addendum)
Notified MD of small amount of bright red blood rectally with small blood clots per pt, patient denies recent history of hemorrhoids, MD acknowledged. Patient requesting nicotine patch, 21 mcg nicotine patch daily per [place], telephone order.

## 2015-10-07 NOTE — Progress Notes (Signed)
[facility] [place] [facility]   PATIENT NAME: Courtney Hebert    MR#:  [id number]  DATE OF BIRTH:  02-06-1976  SUBJECTIVE:   Patient was recently admitted due to heroin overdose and respiratory failure. Now extubated and transferred to the floor yesterday. Remains somewhat lethargic but oriented. Husband at bedside. No chest pain. Mild shortness of breath. No abdominal pain, nausea, vomiting.  REVIEW OF SYSTEMS:    Review of Systems  Constitutional: Positive for malaise/fatigue. Negative for fever and chills.  HENT: Negative for congestion and tinnitus.   Eyes: Negative for blurred vision and double vision.  Respiratory: Positive for shortness of breath. Negative for cough and wheezing.   Cardiovascular: Negative for chest pain, orthopnea and PND.  Gastrointestinal: Negative for nausea, vomiting, abdominal pain and diarrhea.  Genitourinary: Negative for dysuria and hematuria.  Neurological: Positive for weakness. Negative for dizziness, sensory change and focal weakness.  All other systems reviewed and are negative.   Nutrition: Regular Tolerating Diet: yes Tolerating PT: Eval Noted.   DRUG ALLERGIES:   Allergies  Allergen Reactions  . Vancomycin Anaphylaxis    VITALS:  Blood pressure 122/61, pulse 89, temperature 98.3 F (36.8 C), temperature source Oral, resp. rate 18, height 5\' 5"  (1.651 m), weight 141.125 kg (311 lb 2 oz), SpO2 93 %.  PHYSICAL EXAMINATION:   Physical Exam  GENERAL:  40 y.o.-year-old obese patient lying in the bed in no acute distress.  EYES: Pupils equal, round, reactive to light and accommodation. No scleral icterus. Extraocular muscles intact.  HEENT: Head atraumatic, normocephalic. Oropharynx and nasopharynx clear.  NECK:  Supple, no jugular venous distention. No thyroid enlargement, no tenderness.  LUNGS: Diffuse end expiratory wheezing, no refills, rhonchi. No use of accessory muscles of respiration.  CARDIOVASCULAR: S1, S2  normal. No murmurs, rubs, or gallops.  ABDOMEN: Soft, nontender, nondistended. Bowel sounds present. No organomegaly or mass.  EXTREMITIES: No cyanosis, clubbing or edema b/l.    NEUROLOGIC: Cranial nerves II through XII are intact. No focal Motor or sensory deficits b/l.   PSYCHIATRIC: The patient is alert and oriented x 3. Good affect.  SKIN: No obvious rash, lesion, or ulcer.    LABORATORY PANEL:   CBC  Recent Labs Lab 10/07/15 0431  WBC 10.6  HGB 13.5  HCT 41.1  PLT 243   ------------------------------------------------------------------------------------------------------------------  Chemistries   Recent Labs Lab 10/03/15 1940  10/06/15 0453 10/07/15 0431  NA 136  < > 139 140  K 3.9  < > 3.1* 3.6  CL 98*  < > 98* 102  CO2 29  < > 29 25  GLUCOSE 136*  < > 107* 114*  BUN 11  < > 13 14  CREATININE 0.83  < > 0.75 0.75  CALCIUM 8.7*  < > 8.2* 8.5*  MG  --   < > 1.9  --   AST 29  --   --   --   ALT 22  --   --   --   ALKPHOS 80  --   --   --   BILITOT 0.7  --   --   --   < > = values in this interval not displayed. ------------------------------------------------------------------------------------------------------------------  Cardiac Enzymes  Recent Labs Lab 10/03/15 1940  TROPONINI <0.03   ------------------------------------------------------------------------------------------------------------------  RADIOLOGY:  US Venous Img Lower Bilateral  10/06/2015  CLINICAL DATA:  39 year old female with a history of swelling EXAM: BILATERAL LOWER EXTREMITY VENOUS DOPPLER ULTRASOUND TECHNIQUE: Gray-scale sonography with graded compression,  as well as color Doppler and duplex ultrasound were performed to evaluate the lower extremity deep venous systems from the level of the common femoral vein and including the common femoral, femoral, profunda femoral, popliteal and calf veins including the posterior tibial, peroneal and gastrocnemius veins when visible. The  superficial great saphenous vein was also interrogated. Spectral Doppler was utilized to evaluate flow at rest and with distal augmentation maneuvers in the common femoral, femoral and popliteal veins. COMPARISON:  10/29/2013 FINDINGS: RIGHT LOWER EXTREMITY Common Femoral Vein: No evidence of thrombus. Normal compressibility, respiratory phasicity and response to augmentation. Saphenofemoral Junction: No evidence of thrombus. Normal compressibility and flow on color Doppler imaging. Profunda Femoral Vein: No evidence of thrombus. Normal compressibility and flow on color Doppler imaging. Femoral Vein: No evidence of thrombus. Normal compressibility, respiratory phasicity and response to augmentation. Popliteal Vein: No evidence of thrombus. Normal compressibility, respiratory phasicity and response to augmentation. Calf Veins: Calf veins not imaged given the patient's discomfort worth the exam. Superficial Great Saphenous Vein: No evidence of thrombus. Normal compressibility and flow on color Doppler imaging. Other Findings:  None. LEFT LOWER EXTREMITY Common Femoral Vein: No evidence of thrombus. Normal compressibility, respiratory phasicity and response to augmentation. Saphenofemoral Junction: No evidence of thrombus. Normal compressibility and flow on color Doppler imaging. Profunda Femoral Vein: No evidence of thrombus. Normal compressibility and flow on color Doppler imaging. Femoral Vein: No evidence of thrombus. Normal compressibility, respiratory phasicity and response to augmentation. Popliteal Vein: Popliteal vein not image given the patient's discomfort. Calf Veins: Calf veins not imaged given the patient's discomfort. Superficial Great Saphenous Vein: No evidence of thrombus. Normal compressibility and flow on color Doppler imaging. Other Findings:  None. IMPRESSION: Sonographic survey of the bilateral lower extremities negative for DVT. Bilateral calf veins and the left popliteal vein not imaged given  the patient's discomfort with the exam. Signed, Yvone Neu. Loreta Ave, DO [facility] Electronically Signed   By: Gilmer Mor D.O.   On: 10/06/2015 14:15     ASSESSMENT AND PLAN:   40 year old female with past medical history of hypertension, chronic back pain, history of recurrent DVT who presented to the hospital and she was found unresponsive due to drug overdose from heroin.  1. Altered mental status/unresponsiveness-this has resolved. This was secondary to drug overdose and her mental status has improved and back to baseline now.  2. Acute respiratory failure-patient was intubated secondary to unresponsiveness and unable to maintain her airway. -She has now been extubated and is clinically doing well from a respiratory standpoint.  3. History of bipolar disorder/depression-appreciate psychiatric help. -Continue Geodon when necessary, lithium.  4. History of recurrent DVTs-continue warfarin.  5. Polysubstance abuse-patient admitted secondary to heroin overdose. -Patient does not want to go back to a methadone clinic. Appreciate psychiatric input. Continue clonidine, Zofran, Robaxin as per psychiatry. -Patient should consider Subutex for long-term option as an outpatient.  Clinically doing well, ambulating mental status improved I will discharge home tomorrow   All the records are reviewed and case discussed with Care Management/Social Workerr. Management plans discussed with the patient, family and they are in agreement.  CODE STATUS: Full  DVT Prophylaxis: Lovenox  TOTAL TIME TAKING CARE OF THIS PATIENT: 30 minutes.   POSSIBLE D/C IN 1-2 DAYS, DEPENDING ON CLINICAL CONDITION.   Houston Siren M.D on 10/07/2015 at 3:01 PM  Between 7am to 6pm - Pager - [phone]  After 6pm go to [email or web address] - password [email or web address] [facility]  [facility]  Office  [phone]  CC: Primary care physician; No PCP Per  Patient

## 2015-10-07 NOTE — Consult Note (Signed)
Courtney Hebert   Reason for Hebert:  Hebert for 40 year old woman brought into the hospital after overdosing on drugs at home. Concern about drug abuse and mood Referring Physician:  Tressia Miners Patient Identification: Courtney Hebert MRN:  [id number] Principal Diagnosis: Methadone overdose Diagnosis:   Patient Active Problem List   Diagnosis Date Noted  . Bipolar disorder ([place]) [F31.9] 10/06/2015  . Opiate dependence ([place]) [F11.20] 10/06/2015  . Methadone overdose [T40.3X4A] 10/03/2015  . Polysubstance abuse [F19.10] 10/03/2015  . Acute hypoxemic respiratory failure ([place]) [J96.01] 10/03/2015  . Tobacco abuse [Z72.0] 10/03/2015  . Migraine [G43.909] 10/03/2015    Total Time spent with patient: 30 minutes  Subjective:   Courtney Hebert is a 40 y.o. female patient admitted with "well I'm feeling pretty bad, I'm in withdrawal".  Patient seen for follow-up today Tuesday, May 9. Chart reviewed. Also spoke with hospitalist. Also the patient asked me to call her husband to talk with him about her care and future care. I am glad to do this and I tried reaching him at [phone] but was only able to reach voicemail. I left a message with my office number. Patient reports she is feeling much better today. Less acute withdrawal symptoms. More energetic. Eating better. Has been able to get up out of bed and move around. Mood is feeling a little more hopeful. Denies any suicidal thoughts. Not having any hallucinations. Thinking appears to be pretty clear. Patient is medically stabilizing. Likely will be ready for discharge within the next day or so. She tells me that she spoke to her counselor at the methadone clinic by phone today . they discussed, among other things, the possibility of Suboxone being a future treatment. I believe that might be available through the methadone clinic and the patient is encouraged to look into that. Most importantly apparently her counselor told her that  she was still a patient at the methadone clinic and was not being thrown out because of this incident.  HPI:  40 year old woman brought to the hospital after family found her unconscious at home. Patient had overdosed on intravenous drugs. She required treatment in the critical care unit and has now been transferred out to the floor. Patient admits that she shot up with synthetic heroin that she was able to get on line. She says that this is something she had been planning for a while. He was her first relapse in years. She doesn't have any particular justification for it but does report that she's been under a lot of stress recently. Relationship with her husband has been strained. As been extra tension in the home since her daughter moved back in. Patient had been stable in methadone maintenance for several years going to the [facility] methadone clinic. She denies that she used any alcohol. Does admit that she took some Xanax around the same time as well. Patient denies having any intention to kill her self. Mood had been anxious and irritable but not extremely depressed. No suicidal ideation no psychosis. She had been compliant with her prescription lithium which had been her stable psychiatric medicine.  Substance abuse history: Long-standing history of substance abuse primarily opiate dependence. Had been going to the methadone clinic in [place] for years and had been stable on a dose of 150 mg a day of methadone.  Medical history: Patient is overweight. Has a history of blood clots and is on anticoagulant medication. 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 She is married. Has an adult daughter now living back at home.  Past Psychiatric History: Past history of treatment for bipolar disorder. Has had hospitalizations multiple times in the past but the most recent one was 2009.  Does have a past history of suicidality and aggression. Was stabilized on lithium in 2009 and has been doing well on that ever since. Doesn't have a local psychiatric provider but gets treatment through her primary care doctor. Patient has a history of opiate dependence and has been stable at the local methadone clinic. Reports she had tried other antidepressants in the past with poor outcomes.  Risk to Self: Is patient at risk for suicide?: No Risk to Others:   Prior Inpatient Therapy:   Prior Outpatient Therapy:    Past Medical History:  Past Medical History  Diagnosis Date  . Chronic back pain    History reviewed. No pertinent past surgical history. Family History: No family history on file. Family Psychiatric  History: Bipolar disorder in the mother and alcohol dependence in both parents Social History:  History  Alcohol Use  . Yes     History  Drug Use  . Yes  . Special: IV    Comment: heroin    Social History   Social History  . Marital Status: Married    Spouse Name: N/A  . Number of Children: N/A  . Years of Education: N/A   Social History Main Topics  . Smoking status: Current Every Day Smoker  . Smokeless tobacco: None  . Alcohol Use: Yes  . Drug Use: Yes    Special: IV     Comment: heroin  . Sexual Activity: Not Asked   Other Topics Concern  . None   Social History Narrative   Additional Social History:    Allergies:   Allergies  Allergen Reactions  . Vancomycin Anaphylaxis    Labs:  Results for orders placed or performed during the hospital encounter of 10/03/15 (from the past 48 hour(s))  Blood gas, arterial     Status: Abnormal (Preliminary result)   Collection Time: 10/05/15  5:30 PM  Result Value Ref Range   FIO2 0.32    Delivery systems NASAL CANNULA    Inspiratory PAP PENDING    Expiratory PAP PENDING    pH, Arterial 7.51 (H) 7.350 - 7.450   pCO2 arterial 41 32.0 - 48.0 mmHg   pO2, Arterial 54 (L) 83.0 - 108.0 mmHg   Bicarbonate  32.7 (H) 21.0 - 28.0 mEq/L   Acid-Base Excess 8.8 (H) 0.0 - 3.0 mmol/L   O2 Saturation 90.8 %   Patient temperature 37.0    Oxygen index PENDING    Collection site RIGHT RADIAL    Sample type ARTERIAL DRAW    Allens test (pass/fail) POSITIVE (A) PASS  Blood gas, arterial     Status: Abnormal (Preliminary result)   Collection Time: 10/05/15 10:08 PM  Result Value Ref Range   FIO2 0.40    Delivery systems HI FLOW NASAL CANNULA    pH, Arterial 7.52 (H) 7.350 - 7.450   pCO2 arterial 43 32.0 - 48.0 mmHg   pO2, Arterial 62 (L) 83.0 - 108.0 mmHg   Bicarbonate 35.1 (H) 21.0 - 28.0 mEq/L   Acid-Base Excess 10.9 (H) 0.0 - 3.0 mmol/L   O2 Saturation 93.8 %   Patient temperature 37.0    Collection site RIGHT BRACHIAL    Sample type ARTERIAL DRAW  Allens test (pass/fail) PENDING PASS   Mechanical Rate PENDING   CBC     Status: Abnormal   Collection Time: 10/06/15  4:53 AM  Result Value Ref Range   WBC 12.9 (H) 3.6 - 11.0 K/uL   RBC 4.72 3.80 - 5.20 MIL/uL   Hemoglobin 13.5 12.0 - 16.0 g/dL   HCT 41.0 35.0 - 47.0 %   MCV 86.9 80.0 - 100.0 fL   MCH 28.5 26.0 - 34.0 pg   MCHC 32.8 32.0 - 36.0 g/dL   RDW 17.7 (H) 11.5 - 14.5 %   Platelets 240 150 - 440 K/uL  Basic metabolic panel     Status: Abnormal   Collection Time: 10/06/15  4:53 AM  Result Value Ref Range   Sodium 139 135 - 145 mmol/L   Potassium 3.1 (L) 3.5 - 5.1 mmol/L   Chloride 98 (L) 101 - 111 mmol/L   CO2 29 22 - 32 mmol/L   Glucose, Bld 107 (H) 65 - 99 mg/dL   BUN 13 6 - 20 mg/dL   Creatinine, Ser 0.75 0.44 - 1.00 mg/dL   Calcium 8.2 (L) 8.9 - 10.3 mg/dL   GFR calc non Af Amer >60 >60 mL/min   GFR calc Af Amer >60 >60 mL/min    Comment: (NOTE) The eGFR has been calculated using the CKD EPI equation. This calculation has not been validated in all clinical situations. eGFR's persistently <60 mL/min signify possible Chronic Kidney Disease.    Anion gap 12 5 - 15  Magnesium     Status: None   Collection Time:  10/06/15  4:53 AM  Result Value Ref Range   Magnesium 1.9 1.7 - 2.4 mg/dL  Glucose, capillary     Status: Abnormal   Collection Time: 10/06/15 11:39 AM  Result Value Ref Range   Glucose-Capillary 107 (H) 65 - 99 mg/dL  Protime-INR     Status: None   Collection Time: 10/06/15  2:06 PM  Result Value Ref Range   Prothrombin Time 14.2 11.4 - 15.0 seconds   INR 1.08   Protime-INR     Status: None   Collection Time: 10/07/15  4:31 AM  Result Value Ref Range   Prothrombin Time 13.7 11.4 - 15.0 seconds   INR 1.03   CBC     Status: Abnormal   Collection Time: 10/07/15  4:31 AM  Result Value Ref Range   WBC 10.6 3.6 - 11.0 K/uL   RBC 4.73 3.80 - 5.20 MIL/uL   Hemoglobin 13.5 12.0 - 16.0 g/dL   HCT 41.1 35.0 - 47.0 %   MCV 86.8 80.0 - 100.0 fL   MCH 28.6 26.0 - 34.0 pg   MCHC 32.9 32.0 - 36.0 g/dL   RDW 17.4 (H) 11.5 - 14.5 %   Platelets 243 150 - 440 K/uL  Basic metabolic panel     Status: Abnormal   Collection Time: 10/07/15  4:31 AM  Result Value Ref Range   Sodium 140 135 - 145 mmol/L   Potassium 3.6 3.5 - 5.1 mmol/L   Chloride 102 101 - 111 mmol/L   CO2 25 22 - 32 mmol/L   Glucose, Bld 114 (H) 65 - 99 mg/dL   BUN 14 6 - 20 mg/dL   Creatinine, Ser 0.75 0.44 - 1.00 mg/dL   Calcium 8.5 (L) 8.9 - 10.3 mg/dL   GFR calc non Af Amer >60 >60 mL/min   GFR calc Af Amer >60 >60 mL/min    Comment: (NOTE)  The eGFR has been calculated using the CKD EPI equation. This calculation has not been validated in all clinical situations. eGFR's persistently <60 mL/min signify possible Chronic Kidney Disease.    Anion gap 13 5 - 15    Current Facility-Administered Medications  Medication Dose Route Frequency Provider Last Rate Last Dose  . 0.9 %  sodium chloride infusion  250 mL Intravenous PRN Bincy S Varughese, NP      . acetaminophen (TYLENOL) tablet 650 mg  650 mg Oral Q6H PRN Gladstone Lighter, MD   650 mg at 10/07/15 1123  . Ampicillin-Sulbactam (UNASYN) 3 g in sodium chloride 0.9 %  100 mL IVPB  3 g Intravenous Q6H Flora Lipps, MD   3 g at 10/07/15 0832  . antiseptic oral rinse (CPC / CETYLPYRIDINIUM CHLORIDE 0.05%) solution 7 mL  7 mL Mouth Rinse BID Flora Lipps, MD   7 mL at 10/07/15 1000  . Chlorhexidine Gluconate Cloth 2 % PADS 6 each  6 each Topical Q0600 Holley Raring, NP   6 each at 10/07/15 0612  . cloNIDine (CATAPRES) tablet 0.1 mg  0.1 mg Oral Q6H PRN Gonzella Lex, MD   0.1 mg at 10/07/15 1404  . docusate sodium (COLACE) capsule 100 mg  100 mg Oral BID Gladstone Lighter, MD   100 mg at 10/07/15 0833  . enoxaparin (LOVENOX) injection 40 mg  40 mg Subcutaneous Q24H Flora Lipps, MD   40 mg at 10/07/15 1404  . lithium carbonate (LITHOBID) CR tablet 300 mg  300 mg Oral QHS Gonzella Lex, MD   300 mg at 10/06/15 2254  . methocarbamol (ROBAXIN) tablet 1,000 mg  1,000 mg Oral Q6H PRN Gonzella Lex, MD   1,000 mg at 10/07/15 1404  . midazolam (VERSED) injection 1-2 mg  1-2 mg Intravenous Q2H PRN Holley Raring, NP   2 mg at 10/05/15 1931  . mupirocin ointment (BACTROBAN) 2 % 1 application  1 application Nasal BID Holley Raring, NP   1 application at 95/28/41 [phone]  . nicotine (NICODERM CQ - dosed in mg/24 hours) patch 21 mg  21 mg Transdermal Daily Henreitta Leber, MD   21 mg at 10/07/15 1228  . ondansetron (ZOFRAN) injection 4 mg  4 mg Intravenous Q6H PRN Gladstone Lighter, MD   4 mg at 10/06/15 1220  . ondansetron (ZOFRAN) injection 4 mg  4 mg Intravenous Q6H PRN Gonzella Lex, MD   4 mg at 10/07/15 0105  . pneumococcal 23 valent vaccine (PNU-IMMUNE) injection 0.5 mL  0.5 mL Intramuscular Prior to discharge Laverle Hobby, MD      . polyethylene glycol (MIRALAX / GLYCOLAX) packet 17 g  17 g Oral Daily PRN Gladstone Lighter, MD      . senna (SENOKOT) tablet 8.6 mg  1 tablet Oral Daily Gladstone Lighter, MD   8.6 mg at 10/07/15 0833  . warfarin (COUMADIN) tablet 7.5 mg  7.5 mg Oral q1800 Flora Lipps, MD   7.5 mg at 10/06/15 1806  . Warfarin - Pharmacist  Dosing Inpatient   Does not apply [id number] Flora Lipps, MD      . ziprasidone (GEODON) injection 20 mg  20 mg Intramuscular BID PRN Flora Lipps, MD   20 mg at 10/05/15 1645    Musculoskeletal: Strength & Muscle Tone: decreased Gait & Station: unable to stand Patient leans: N/A  Psychiatric Specialty Exam: Review of Systems  Constitutional: Negative for malaise/fatigue.  HENT: Negative.   Eyes: Negative.  Respiratory: Negative.   Cardiovascular: Negative.   Gastrointestinal: Negative for abdominal pain and diarrhea.  Musculoskeletal: Positive for back pain. Negative for myalgias.  Skin: Negative.   Neurological: Positive for weakness.  Psychiatric/Behavioral: Positive for substance abuse. Negative for depression, suicidal ideas, hallucinations and memory loss. The patient is nervous/anxious. The patient does not have insomnia.     Blood pressure 122/61, pulse 89, temperature 98.3 F (36.8 C), temperature source Oral, resp. rate 18, height 5' 5"  (1.651 m), weight 141.125 kg (311 lb 2 oz), SpO2 93 %.Body mass index is 51.77 kg/(m^2).  General Appearance: Fairly Groomed  Engineer, water::  Minimal  Speech:  Slow  Volume:  Normal  Mood:  Dysphoric  Affect:  Congruent  Thought Process:  Goal Directed  Orientation:  Full (Time, Place, and Person)  Thought Content:  Negative  Suicidal Thoughts:  No  Homicidal Thoughts:  No  Memory:  Immediate;   Good Recent;   Fair Remote;   Fair  Judgement:  Fair  Insight:  Fair  Psychomotor Activity:  Decreased  Concentration:  Fair  Recall:  [employer] of Knowledge:Fair  Language: Fair  Akathisia:  No  Handed:  Right  AIMS (if indicated):     Assets:  Communication Skills Desire for Improvement Financial Resources/Insurance Housing Resilience  ADL's:  Intact  Cognition: WNL  Sleep:      Treatment Plan Summary: Daily contact with patient to assess and evaluate symptoms and progress in treatment, Medication management and Plan Patient  tolerated resuming lithium. Mood today seems stable and appropriate. Thoughts appear lucid. Patient did a very appropriate step and calling her substance abuse counselor. We discussed appropriate future treatment. Patient realizes that this was a life-threatening situation on her part. She also however is appropriately frightened of relapsing into more substance use. I supported her concern and encouraged her to above all stay involved with the methadone clinic. We have talked a little bit about Suboxone. She requested that I call her husband to talk with him as he may have some questions about it. As I said above I'm glad to do this and will be glad to talk to him if he calls me back. No other change to medicine. Supportive counseling completed.  Disposition: Patient does not meet criteria for psychiatric inpatient admission. Supportive therapy provided about ongoing stressors.  Alethia Berthold, MD 10/07/2015 3:44 PM

## 2015-10-08 LAB — BLOOD GAS, ARTERIAL
ACID-BASE EXCESS: 10.9 mmol/L — AB (ref 0.0–3.0)
ALLENS TEST (PASS/FAIL): POSITIVE — AB
Acid-Base Excess: 8.8 mmol/L — ABNORMAL HIGH (ref 0.0–3.0)
Allens test (pass/fail): POSITIVE — AB
BICARBONATE: 32.7 meq/L — AB (ref 21.0–28.0)
BICARBONATE: 35.1 meq/L — AB (ref 21.0–28.0)
FIO2: 0.32
FIO2: 0.4
O2 SAT: 90.8 %
O2 Saturation: 93.8 %
PATIENT TEMPERATURE: 37
PCO2 ART: 43 mmHg (ref 32.0–48.0)
PH ART: 7.52 — AB (ref 7.350–7.450)
PO2 ART: 54 mmHg — AB (ref 83.0–108.0)
Patient temperature: 37
pCO2 arterial: 41 mmHg (ref 32.0–48.0)
pH, Arterial: 7.51 — ABNORMAL HIGH (ref 7.350–7.450)
pO2, Arterial: 62 mmHg — ABNORMAL LOW (ref 83.0–108.0)

## 2015-10-08 MED ORDER — ONDANSETRON HCL 4 MG PO TABS: 4.0000 mg | ORAL_TABLET | Freq: Three times a day (TID) | ORAL | Status: AC | PRN

## 2015-10-08 NOTE — Discharge Summary (Signed)
[facility] [place] [facility]   PATIENT NAME: Courtney Hebert    MR#:  [id number]  DATE OF BIRTH:  May 30, 1976  DATE OF ADMISSION:  10/03/2015 ADMITTING PHYSICIAN: No admitting provider for patient encounter.  DATE OF DISCHARGE: 10/08/2015 11:00 AM  PRIMARY CARE PHYSICIAN: No PCP Per Patient    ADMISSION DIAGNOSIS:  Encounter for intubation [Z01.818] Acute respiratory failure with hypoxia (HCC) [J96.01] Unresponsiveness [R40.4]  DISCHARGE DIAGNOSIS:  Principal Problem:   Methadone overdose Active Problems:   Polysubstance abuse   Acute hypoxemic respiratory failure (HCC)   Tobacco abuse   Migraine   Bipolar disorder (HCC)   Opiate dependence (HCC)   Bipolar affective disorder, currently depressed, moderate (HCC)   SECONDARY DIAGNOSIS:   Past Medical History  Diagnosis Date  . Chronic back pain     HOSPITAL COURSE:   40 year old female with past medical history of hypertension, chronic back pain, history of recurrent DVT who presented to the hospital and she was found unresponsive due to drug overdose from heroin.  1. Altered mental status/unresponsiveness-this has resolved. This was secondary to drug overdose and her mental status has improved and now back to baseline now.  2. Acute respiratory failure-patient was intubated secondary to unresponsiveness and unable to maintain her airway. -She has now been extubated and is clinically doing well from a respiratory standpoint. - no evidence of pneumonia and was given Unasyn empirically for aspiration but presently not being discharged on any abx.   3. History of bipolar disorder/depression-appreciate psychiatric help. - she will resume her Lithium  4. History of recurrent DVTs- she will continue warfarin.  5. Polysubstance abuse-patient admitted secondary to methadone/heroin overdose. -Patient was seen by psychiatry and placed on some Zofran, clonidine and Robaxin to help with her withdrawal. She has  clinically improved. She was advised to follow-up with her counselor at the methadone clinic to look for other options to help her with her substance abuse. She should discuss about possible Suboxone/Subutex to help with her opiate withdrawal -She was given some Zofran as needed to help with her symptoms.  DISCHARGE CONDITIONS:   Stable  CONSULTS OBTAINED:  Treatment Team:  Audery Amel, MD  DRUG ALLERGIES:   Allergies  Allergen Reactions  . Vancomycin Anaphylaxis    DISCHARGE MEDICATIONS:   Discharge Medication List as of 10/08/2015  9:08 AM    START taking these medications   Details  ondansetron (ZOFRAN) 4 MG tablet Take 1 tablet (4 mg total) by mouth every 8 (eight) hours as needed for nausea or vomiting., Starting 10/08/2015, Until Discontinued, Print      CONTINUE these medications which have NOT CHANGED   Details  doxepin (SINEQUAN) 10 MG capsule Take 10-20 mg by mouth at bedtime as needed (for sleep)., Until Discontinued, Historical Med    hydrochlorothiazide (HYDRODIURIL) 25 MG tablet Take 25 mg by mouth daily., Until Discontinued, Historical Med    lithium carbonate 300 MG capsule Take 300 mg by mouth daily., Until Discontinued, Historical Med    tiZANidine (ZANAFLEX) 2 MG tablet Take 2 mg by mouth 2 (two) times daily., Until Discontinued, Historical Med    traZODone (DESYREL) 150 MG tablet Take 150 mg by mouth at bedtime., Until Discontinued, Historical Med    warfarin (COUMADIN) 5 MG tablet Take 5 mg by mouth daily., Until Discontinued, Historical Med      STOP taking these medications     methadone (DOLOPHINE) 10 MG/5ML solution  DISCHARGE INSTRUCTIONS:   DIET:  Regular diet  DISCHARGE CONDITION:  Stable  ACTIVITY:  Activity as tolerated  OXYGEN:  Home Oxygen: No.   Oxygen Delivery: room air  DISCHARGE LOCATION:  home   If you experience worsening of your admission symptoms, develop shortness of breath, life threatening  emergency, suicidal or homicidal thoughts you must seek medical attention immediately by calling 911 or calling your MD immediately  if symptoms less severe.  You Must read complete instructions/literature along with all the possible adverse reactions/side effects for all the Medicines you take and that have been prescribed to you. Take any new Medicines after you have completely understood and accpet all the possible adverse reactions/side effects.   Please note  You were cared for by a hospitalist during your hospital stay. If you have any questions about your discharge medications or the care you received while you were in the hospital after you are discharged, you can call the unit and asked to speak with the hospitalist on call if the hospitalist that took care of you is not available. Once you are discharged, your primary care physician will handle any further medical issues. Please note that NO REFILLS for any discharge medications will be authorized once you are discharged, as it is imperative that you return to your primary care physician (or establish a relationship with a primary care physician if you do not have one) for your aftercare needs so that they can reassess your need for medications and monitor your lab values.     Today   No complaints presently.  Anxious and wants to go home.    VITAL SIGNS:  Blood pressure 122/62, pulse 58, temperature 97.9 F (36.6 C), temperature source Oral, resp. rate 18, height  (1.651 m), weight 141.613 kg (312 lb 3.2 oz), SpO2 100 %.  I/O:  No intake or output data in the 24 hours ending 10/08/15 1353  PHYSICAL EXAMINATION:   GENERAL: 40 y.o.-year-old obese patient lying in the bed in no acute distress.  EYES: Pupils equal, round, reactive to light and accommodation. No scleral icterus. Extraocular muscles intact.  HEENT: Head atraumatic, normocephalic. Oropharynx and nasopharynx clear.  NECK: Supple, no jugular venous distention.  No thyroid enlargement, no tenderness.  LUNGS: Diffuse end expiratory wheezing, no refills, rhonchi. No use of accessory muscles of respiration.  CARDIOVASCULAR: S1, S2 normal. No murmurs, rubs, or gallops.  ABDOMEN: Soft, nontender, nondistended. Bowel sounds present. No organomegaly or mass.  EXTREMITIES: No cyanosis, clubbing or edema b/l.  NEUROLOGIC: Cranial nerves II through XII are intact. No focal Motor or sensory deficits b/l.  PSYCHIATRIC: The patient is alert and oriented x 3. Good affect.  SKIN: No obvious rash, lesion, or ulcer.   DATA REVIEW:   CBC  Recent Labs Lab 10/07/15 0431  WBC 10.6  HGB 13.5  HCT 41.1  PLT 243    Chemistries   Recent Labs Lab 10/03/15 1940  10/06/15 0453 10/07/15 0431  NA 136  < > 139 140  K 3.9  < > 3.1* 3.6  CL 98*  < > 98* 102  CO2 29  < > 29 25  GLUCOSE 136*  < > 107* 114*  BUN 11  < > 13 14  CREATININE 0.83  < > 0.75 0.75  CALCIUM 8.7*  < > 8.2* 8.5*  MG  --   < > 1.9  --   AST 29  --   --   --   ALT 22  --   --   --  ALKPHOS 80  --   --   --   BILITOT 0.7  --   --   --   < > = values in this interval not displayed.  Cardiac Enzymes  Recent Labs Lab 10/03/15 1940  TROPONINI <0.03    Microbiology Results  Results for orders placed or performed during the hospital encounter of 10/03/15  MRSA PCR Screening     Status: Abnormal   Collection Time: 10/04/15 12:46 AM  Result Value Ref Range Status   MRSA by PCR POSITIVE (A) NEGATIVE Final    Comment: CRITICAL RESULT CALLED TO, READ BACK BY AND VERIFIED WITH: RENEE BABB AT 0232 ON 10/04/15 RWW        The GeneXpert MRSA Assay (FDA approved for NASAL specimens only), is one component of a comprehensive MRSA colonization surveillance program. It is not intended to diagnose MRSA infection nor to guide or monitor treatment for MRSA infections.     RADIOLOGY:  US Venous Img Lower Bilateral  10/06/2015  CLINICAL DATA:  40 year old female with a history of  swelling EXAM: BILATERAL LOWER EXTREMITY VENOUS DOPPLER ULTRASOUND TECHNIQUE: Gray-scale sonography with graded compression, as well as color Doppler and duplex ultrasound were performed to evaluate the lower extremity deep venous systems from the level of the common femoral vein and including the common femoral, femoral, profunda femoral, popliteal and calf veins including the posterior tibial, peroneal and gastrocnemius veins when visible. The superficial great saphenous vein was also interrogated. Spectral Doppler was utilized to evaluate flow at rest and with distal augmentation maneuvers in the common femoral, femoral and popliteal veins. COMPARISON:  10/29/2013 FINDINGS: RIGHT LOWER EXTREMITY Common Femoral Vein: No evidence of thrombus. Normal compressibility, respiratory phasicity and response to augmentation. Saphenofemoral Junction: No evidence of thrombus. Normal compressibility and flow on color Doppler imaging. Profunda Femoral Vein: No evidence of thrombus. Normal compressibility and flow on color Doppler imaging. Femoral Vein: No evidence of thrombus. Normal compressibility, respiratory phasicity and response to augmentation. Popliteal Vein: No evidence of thrombus. Normal compressibility, respiratory phasicity and response to augmentation. Calf Veins: Calf veins not imaged given the patient's discomfort worth the exam. Superficial Great Saphenous Vein: No evidence of thrombus. Normal compressibility and flow on color Doppler imaging. Other Findings:  None. LEFT LOWER EXTREMITY Common Femoral Vein: No evidence of thrombus. Normal compressibility, respiratory phasicity and response to augmentation. Saphenofemoral Junction: No evidence of thrombus. Normal compressibility and flow on color Doppler imaging. Profunda Femoral Vein: No evidence of thrombus. Normal compressibility and flow on color Doppler imaging. Femoral Vein: No evidence of thrombus. Normal compressibility, respiratory phasicity and  response to augmentation. Popliteal Vein: Popliteal vein not image given the patient's discomfort. Calf Veins: Calf veins not imaged given the patient's discomfort. Superficial Great Saphenous Vein: No evidence of thrombus. Normal compressibility and flow on color Doppler imaging. Other Findings:  None. IMPRESSION: Sonographic survey of the bilateral lower extremities negative for DVT. Bilateral calf veins and the left popliteal vein not imaged given the patient's discomfort with the exam. Signed, Yvone Neu. Loreta Ave, DO [facility] Rad[facility] Electronically Signed   By: Gilmer Mor D.O.   On: 10/06/2015 14:15      Management plans discussed with the patient, family and they are in agreement.  CODE STATUS:     Code Status Orders        Start     Ordered   10/03/15 2249  Full code   Continuous     10/03/15 2252  Code Status History    Date Active Date Inactive Code Status Order ID Comments User Context   This patient has a current code status but no historical code status.      TOTAL TIME TAKING CARE OF THIS PATIENT: 40 minutes.    Houston SirenSAINANI,VIVEK J M.D on 10/08/2015 at 1:53 PM  Between 7am to 6pm - Pager - [phone]  After 6pm go to [email or web address] - password [email or web address] [facility]  [facility]  Office  [phone]  CC: Primary care physician; No PCP Per Patient

## 2015-10-08 NOTE — Care Management (Signed)
No discharge needs identified by care team.  Patient does not appear to be on any cost prohibitive meds.  She was on coumadin prior to admission.  Very hurried to discharge before could be fully assessed by this  CM.

## 2015-10-08 NOTE — Consult Note (Signed)
[place] [facility] Consult   Reason for Consult:  Consult for 40 year old woman brought into the hospital after overdosing on drugs at home. Concern about drug abuse and mood Referring Physician:  Tressia Miners Patient Identification: Courtney Hebert MRN:  [id number] Principal Diagnosis: Methadone overdose Diagnosis:   Patient Active Problem List   Diagnosis Date Noted  . Bipolar affective disorder, currently depressed, moderate ([place]) [F31.32]   . Bipolar disorder ([place]) [F31.9] 10/06/2015  . Opiate dependence ([place]) [F11.20] 10/06/2015  . Methadone overdose [T40.3X4A] 10/03/2015  . Polysubstance abuse [F19.10] 10/03/2015  . Acute hypoxemic respiratory failure ([place]) [J96.01] 10/03/2015  . Tobacco abuse [Z72.0] 10/03/2015  . Migraine [G43.909] 10/03/2015    Total Time spent with patient: 30 minutes  Subjective:   Courtney Hebert is a 39 y.o. female patient admitted with "well I'm feeling pretty bad, I'm in withdrawal".  Follow-up today Wednesday the 10th. Case reviewed with hospitalist. Patient interviewed. At her request I also spoke with her husband. Patient is still having some aches and pains and nausea as would be expected with her withdrawal from methadone. She understands that because of its long half-life she is liable to continue to have withdrawal symptoms for some time possibly weeks. Patient is not reporting any depression not reporting suicidal ideation. Reports feeling motivated to get back into effective treatment. I got to talk with her husband face-to-face today. Husband appears to be very frustrated and irritated with the situation. He made it clear to the patient that long-term he did not want her to be on any kind of controlled substance. Rather than address this directly issue I discussed with the patient and her husband the nature of opiate dependence as a long-term recurrent problem and the potential value of some medications such as buprenorphine or methadone to help  people stay off of abusable drugs otherwise. The husband appears to accept at least the possibility that this can be a temporary issue. Patient does not require any further psychiatric intervention at this point from me.  HPI:  40 year old woman brought to the hospital after family found her unconscious at home. Patient had overdosed on intravenous drugs. She required treatment in the critical care unit and has now been transferred out to the floor. Patient admits that she shot up with synthetic heroin that she was able to get on line. She says that this is something she had been planning for a while. He was her first relapse in years. She doesn't have any particular justification for it but does report that she's been under a lot of stress recently. Relationship with her husband has been strained. As been extra tension in the home since her daughter moved back in. Patient had been stable in methadone maintenance for several years going to the [facility] methadone clinic. She denies that she used any alcohol. Does admit that she took some Xanax around the same time as well. Patient denies having any intention to kill her self. Mood had been anxious and irritable but not extremely depressed. No suicidal ideation no psychosis. She had been compliant with her prescription lithium which had been her stable psychiatric medicine.  Substance abuse history: Long-standing history of substance abuse primarily opiate dependence. Had been going to the methadone clinic in [place] for years and had been stable on a dose of 150 mg a day of methadone.  Medical history: Patient is overweight. Has a history of blood clots and is on anticoagulant medication. 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 She is married. Has an adult daughter now living back at home.  Past Psychiatric History: Past history of  treatment for bipolar disorder. Has had hospitalizations multiple times in the past but the most recent one was 2009. Does have a past history of suicidality and aggression. Was stabilized on lithium in 2009 and has been doing well on that ever since. Doesn't have a local psychiatric provider but gets treatment through her primary care doctor. Patient has a history of opiate dependence and has been stable at the local methadone clinic. Reports she had tried other antidepressants in the past with poor outcomes.  Risk to Self: Is patient at risk for suicide?: No Risk to Others:   Prior Inpatient Therapy:   Prior Outpatient Therapy:    Past Medical History:  Past Medical History  Diagnosis Date  . Chronic back pain    History reviewed. No pertinent past surgical history. Family History: No family history on file. Family Psychiatric  History: Bipolar disorder in the mother and alcohol dependence in both parents Social History:  History  Alcohol Use  . Yes     History  Drug Use  . Yes  . Special: IV    Comment: heroin    Social History   Social History  . Marital Status: Married    Spouse Name: N/A  . Number of Children: N/A  . Years of Education: N/A   Social History Main Topics  . Smoking status: Current Every Day Smoker  . Smokeless tobacco: None  . Alcohol Use: Yes  . Drug Use: Yes    Special: IV     Comment: heroin  . Sexual Activity: Not Asked   Other Topics Concern  . None   Social History Narrative   Additional Social History:    Allergies:   Allergies  Allergen Reactions  . Vancomycin Anaphylaxis    Labs:  Results for orders placed or performed during the hospital encounter of 10/03/15 (from the past 48 hour(s))  Glucose, capillary     Status: Abnormal   Collection Time: 10/06/15 11:39 AM  Result Value Ref Range   Glucose-Capillary 107 (H) 65 - 99 mg/dL  Protime-INR     Status: None   Collection Time: 10/06/15  2:06 PM  Result Value Ref Range    Prothrombin Time 14.2 11.4 - 15.0 seconds   INR 1.08   Protime-INR     Status: None   Collection Time: 10/07/15  4:31 AM  Result Value Ref Range   Prothrombin Time 13.7 11.4 - 15.0 seconds   INR 1.03   CBC     Status: Abnormal   Collection Time: 10/07/15  4:31 AM  Result Value Ref Range   WBC 10.6 3.6 - 11.0 K/uL   RBC 4.73 3.80 - 5.20 MIL/uL   Hemoglobin 13.5 12.0 - 16.0 g/dL   HCT 41.1 35.0 - 47.0 %   MCV 86.8 80.0 - 100.0 fL   MCH 28.6 26.0 - 34.0 pg   MCHC 32.9 32.0 - 36.0 g/dL   RDW 17.4 (H) 11.5 - 14.5 %   Platelets 243 150 - 440 K/uL  Basic metabolic panel     Status: Abnormal   Collection Time: 10/07/15  4:31 AM  Result Value Ref Range   Sodium 140 135 - 145 mmol/L   Potassium 3.6 3.5 - 5.1 mmol/L   Chloride 102 101 - 111 mmol/L   CO2 25  22 - 32 mmol/L   Glucose, Bld 114 (H) 65 - 99 mg/dL   BUN 14 6 - 20 mg/dL   Creatinine, Ser 0.75 0.44 - 1.00 mg/dL   Calcium 8.5 (L) 8.9 - 10.3 mg/dL   GFR calc non Af Amer >60 >60 mL/min   GFR calc Af Amer >60 >60 mL/min    Comment: (NOTE) The eGFR has been calculated using the CKD EPI equation. This calculation has not been validated in all clinical situations. eGFR's persistently <60 mL/min signify possible Chronic Kidney Disease.    Anion gap 13 5 - 15    Current Facility-Administered Medications  Medication Dose Route Frequency Provider Last Rate Last Dose  . 0.9 %  sodium chloride infusion  250 mL Intravenous PRN Bincy S Varughese, NP      . acetaminophen (TYLENOL) tablet 650 mg  650 mg Oral Q6H PRN Gladstone Lighter, MD   650 mg at 10/08/15 0840  . Ampicillin-Sulbactam (UNASYN) 3 g in sodium chloride 0.9 % 100 mL IVPB  3 g Intravenous Q6H Flora Lipps, MD   3 g at 10/08/15 0508  . antiseptic oral rinse (CPC / CETYLPYRIDINIUM CHLORIDE 0.05%) solution 7 mL  7 mL Mouth Rinse BID Flora Lipps, MD   7 mL at 10/07/15 2046  . Chlorhexidine Gluconate Cloth 2 % PADS 6 each  6 each Topical Q0600 Holley Raring, NP   6 each at  10/07/15 0612  . cloNIDine (CATAPRES) tablet 0.1 mg  0.1 mg Oral Q6H PRN Gonzella Lex, MD   0.1 mg at 10/08/15 0840  . docusate sodium (COLACE) capsule 100 mg  100 mg Oral BID Gladstone Lighter, MD   100 mg at 10/08/15 0840  . enoxaparin (LOVENOX) injection 40 mg  40 mg Subcutaneous Q24H Flora Lipps, MD   40 mg at 10/07/15 1404  . lithium carbonate (LITHOBID) CR tablet 300 mg  300 mg Oral QHS Gonzella Lex, MD   300 mg at 10/07/15 2029  . methocarbamol (ROBAXIN) tablet 1,000 mg  1,000 mg Oral Q6H PRN Gonzella Lex, MD   1,000 mg at 10/08/15 0840  . midazolam (VERSED) injection 1-2 mg  1-2 mg Intravenous Q2H PRN Holley Raring, NP   2 mg at 10/05/15 1931  . mupirocin ointment (BACTROBAN) 2 % 1 application  1 application Nasal BID Holley Raring, NP   1 application at 74/08/14 2029  . nicotine (NICODERM CQ - dosed in mg/24 hours) patch 21 mg  21 mg Transdermal Daily Henreitta Leber, MD   21 mg at 10/08/15 0844  . ondansetron (ZOFRAN) injection 4 mg  4 mg Intravenous Q6H PRN Gladstone Lighter, MD   4 mg at 10/06/15 1220  . ondansetron (ZOFRAN) injection 4 mg  4 mg Intravenous Q6H PRN Gonzella Lex, MD   4 mg at 10/08/15 0839  . pneumococcal 23 valent vaccine (PNU-IMMUNE) injection 0.5 mL  0.5 mL Intramuscular Prior to discharge Laverle Hobby, MD      . polyethylene glycol (MIRALAX / GLYCOLAX) packet 17 g  17 g Oral Daily PRN Gladstone Lighter, MD      . senna (SENOKOT) tablet 8.6 mg  1 tablet Oral Daily Gladstone Lighter, MD   8.6 mg at 10/08/15 0840  . warfarin (COUMADIN) tablet 7.5 mg  7.5 mg Oral q1800 Flora Lipps, MD   7.5 mg at 10/07/15 1714  . Warfarin - Pharmacist Dosing Inpatient   Does not apply [id number] Flora Lipps, MD      .  ziprasidone (GEODON) injection 20 mg  20 mg Intramuscular BID PRN Flora Lipps, MD   20 mg at 10/05/15 1645    Musculoskeletal: Strength & Muscle Tone: decreased Gait & Station: unable to stand Patient leans: N/A  Psychiatric Specialty Exam: Review  of Systems  Constitutional: Negative for malaise/fatigue.  HENT: Negative.   Eyes: Negative.   Respiratory: Negative.   Cardiovascular: Negative.   Gastrointestinal: Positive for nausea. Negative for abdominal pain and diarrhea.  Musculoskeletal: Positive for back pain. Negative for myalgias.  Skin: Negative.   Neurological: Positive for weakness.  Psychiatric/Behavioral: Positive for substance abuse. Negative for depression, suicidal ideas, hallucinations and memory loss. The patient is nervous/anxious. The patient does not have insomnia.     Blood pressure 122/62, pulse 58, temperature 97.9 F (36.6 C), temperature source Oral, resp. rate 18, height 5' 5"  (1.651 m), weight 141.613 kg (312 lb 3.2 oz), SpO2 100 %.Body mass index is 51.95 kg/(m^2).  General Appearance: Fairly Groomed  Engineer, water::  Minimal  Speech:  Slow  Volume:  Normal  Mood:  Dysphoric  Affect:  Congruent  Thought Process:  Goal Directed  Orientation:  Full (Time, Place, and Person)  Thought Content:  Negative  Suicidal Thoughts:  No  Homicidal Thoughts:  No  Memory:  Immediate;   Good Recent;   Fair Remote;   Fair  Judgement:  Fair  Insight:  Fair  Psychomotor Activity:  Decreased  Concentration:  Fair  Recall:  [employer] of Knowledge:Fair  Language: Fair  Akathisia:  No  Handed:  Right  AIMS (if indicated):     Assets:  Communication Skills Desire for Improvement Financial Resources/Insurance Housing Resilience  ADL's:  Intact  Cognition: WNL  Sleep:      Treatment Plan Summary: Daily contact with patient to assess and evaluate symptoms and progress in treatment, Medication management and Plan It would not be legal or appropriate to provide prescriptions for either methadone or buprenorphine in any form at discharge. Patient is aware of this. She has been strongly advised and this has been stated to her husband as well that she needs to go back to the methadone clinic talk with her counselor  there engage in honest dialogue and come up with a treatment plan that we'll help her get back into long-term sobriety. No other change in any medication at this point. It should be safe to provide Zofran or other non-abusable medicines for nausea or aches and pains at discharge if the hospitalist is comfortable with this. Patient is agreeable to the plan.  Disposition: Patient does not meet criteria for psychiatric inpatient admission. Supportive therapy provided about ongoing stressors.  Alethia Berthold, MD 10/08/2015 9:55 AM

## 2015-10-08 NOTE — Progress Notes (Signed)
Discussed discharge instructions and medications with pt and her husband.  Pt's IJ removed per order.  No questions at this time.  Pt transported home via car by her husband.  Orson Apeanielle Abria Vannostrand, RN

## 2015-10-17 ENCOUNTER — Emergency Department
Admission: EM | Admit: 2015-10-17 | Discharge: 2015-10-17 | Disposition: A | Discharge status: Home / Self Care | Payer: Self-pay | Attending: Emergency Medicine | Admitting: Emergency Medicine

## 2015-10-17 ENCOUNTER — Emergency Department (HOSPITAL_BASED_OUTPATIENT_CLINIC_OR_DEPARTMENT_OTHER)
Admit: 2015-10-17 | Discharge: 2015-10-17 | Disposition: A | Discharge status: Home / Self Care | Payer: Self-pay | Attending: Emergency Medicine | Admitting: Emergency Medicine

## 2015-10-17 DIAGNOSIS — R4182 Altered mental status, unspecified: Secondary | ICD-10-CM | POA: Insufficient documentation

## 2015-10-17 DIAGNOSIS — F1110 Opioid abuse, uncomplicated: Secondary | ICD-10-CM | POA: Insufficient documentation

## 2015-10-17 DIAGNOSIS — R012 Other cardiac sounds: Secondary | ICD-10-CM

## 2015-10-17 DIAGNOSIS — F17210 Nicotine dependence, cigarettes, uncomplicated: Secondary | ICD-10-CM | POA: Insufficient documentation

## 2015-10-17 DIAGNOSIS — F3132 Bipolar disorder, current episode depressed, moderate: Secondary | ICD-10-CM | POA: Insufficient documentation

## 2015-10-17 DIAGNOSIS — T424X1A Poisoning by benzodiazepines, accidental (unintentional), initial encounter: Secondary | ICD-10-CM | POA: Insufficient documentation

## 2015-10-17 DIAGNOSIS — R531 Weakness: Secondary | ICD-10-CM | POA: Insufficient documentation

## 2015-10-17 DIAGNOSIS — T50901A Poisoning by unspecified drugs, medicaments and biological substances, accidental (unintentional), initial encounter: Secondary | ICD-10-CM

## 2015-10-17 DIAGNOSIS — Z7901 Long term (current) use of anticoagulants: Secondary | ICD-10-CM | POA: Insufficient documentation

## 2015-10-17 HISTORY — DX: Acute embolism and thrombosis of unspecified deep veins of unspecified lower extremity: I82.409

## 2015-10-17 HISTORY — DX: Bipolar disorder, unspecified: F31.9

## 2015-10-17 HISTORY — DX: Unspecified disorder of circulatory system: I99.9

## 2015-10-17 LAB — CBC WITH DIFFERENTIAL/PLATELET
Basophils Absolute: 0 10*3/uL (ref 0–0.1)
Basophils Relative: 0 %
Eosinophils Absolute: 0 10*3/uL (ref 0–0.7)
HCT: 49 % — ABNORMAL HIGH (ref 35.0–47.0)
Hemoglobin: 16.2 g/dL — ABNORMAL HIGH (ref 12.0–16.0)
LYMPHS ABS: 1.3 10*3/uL (ref 1.0–3.6)
MCH: 28.5 pg (ref 26.0–34.0)
MCHC: 33 g/dL (ref 32.0–36.0)
MCV: 86.1 fL (ref 80.0–100.0)
MONO ABS: 0.8 10*3/uL (ref 0.2–0.9)
Neutro Abs: 13.8 10*3/uL — ABNORMAL HIGH (ref 1.4–6.5)
Neutrophils Relative %: 87 %
PLATELETS: 333 10*3/uL (ref 150–440)
RBC: 5.69 MIL/uL — ABNORMAL HIGH (ref 3.80–5.20)
RDW: 17 % — AB (ref 11.5–14.5)
WBC: 15.9 10*3/uL — ABNORMAL HIGH (ref 3.6–11.0)

## 2015-10-17 LAB — URINE DRUG SCREEN, QUALITATIVE (ARMC ONLY)
AMPHETAMINES, UR SCREEN: NOT DETECTED
Barbiturates, Ur Screen: NOT DETECTED
Benzodiazepine, Ur Scrn: POSITIVE — AB
Cannabinoid 50 Ng, Ur ~~LOC~~: NOT DETECTED
Cocaine Metabolite,Ur ~~LOC~~: NOT DETECTED
MDMA (ECSTASY) UR SCREEN: NOT DETECTED
METHADONE SCREEN, URINE: POSITIVE — AB
Opiate, Ur Screen: NOT DETECTED
Phencyclidine (PCP) Ur S: NOT DETECTED
Tricyclic, Ur Screen: POSITIVE — AB

## 2015-10-17 LAB — URINALYSIS COMPLETE WITH MICROSCOPIC (ARMC ONLY)
BILIRUBIN URINE: NEGATIVE
GLUCOSE, UA: NEGATIVE mg/dL
HGB URINE DIPSTICK: NEGATIVE
Leukocytes, UA: NEGATIVE
Nitrite: NEGATIVE
Protein, ur: 30 mg/dL — AB
RBC / HPF: NONE SEEN RBC/hpf (ref 0–5)
Specific Gravity, Urine: 1.023 (ref 1.005–1.030)
WBC, UA: NONE SEEN WBC/hpf (ref 0–5)
pH: 5 (ref 5.0–8.0)

## 2015-10-17 LAB — BLOOD GAS, VENOUS
Acid-Base Excess: 0.9 mmol/L (ref 0.0–3.0)
Bicarbonate: 26.6 mEq/L (ref 21.0–28.0)
FIO2: 28
O2 Saturation: 82.5 %
PATIENT TEMPERATURE: 37
PH VEN: 7.38 (ref 7.320–7.430)
pCO2, Ven: 45 mmHg (ref 44.0–60.0)
pO2, Ven: 48 mmHg — ABNORMAL HIGH (ref 31.0–45.0)

## 2015-10-17 LAB — COMPREHENSIVE METABOLIC PANEL
ALT: 39 U/L (ref 14–54)
AST: 61 U/L — ABNORMAL HIGH (ref 15–41)
Albumin: 4.1 g/dL (ref 3.5–5.0)
Alkaline Phosphatase: 92 U/L (ref 38–126)
Anion gap: 11 (ref 5–15)
BUN: 26 mg/dL — ABNORMAL HIGH (ref 6–20)
CHLORIDE: 101 mmol/L (ref 101–111)
CO2: 26 mmol/L (ref 22–32)
CREATININE: 1.36 mg/dL — AB (ref 0.44–1.00)
Calcium: 9.5 mg/dL (ref 8.9–10.3)
GFR, EST AFRICAN AMERICAN: 55 mL/min — AB (ref >60)
GFR, EST NON AFRICAN AMERICAN: 48 mL/min — AB (ref >60)
Glucose, Bld: 110 mg/dL — ABNORMAL HIGH (ref 65–99)
Potassium: 4 mmol/L (ref 3.5–5.1)
Sodium: 138 mmol/L (ref 135–145)
Total Bilirubin: 0.8 mg/dL (ref 0.3–1.2)
Total Protein: 8.4 g/dL — ABNORMAL HIGH (ref 6.5–8.1)

## 2015-10-17 LAB — ETHANOL

## 2015-10-17 LAB — AMMONIA: Ammonia: 23 umol/L (ref 9–35)

## 2015-10-17 LAB — ECHOCARDIOGRAM COMPLETE
HEIGHTINCHES: 67 in
WEIGHTICAEL: 5120 [oz_av]

## 2015-10-17 LAB — TROPONIN I
TROPONIN I: 0.05 ng/mL — AB (ref <0.031)
TROPONIN I: 0.05 ng/mL — AB (ref <0.031)

## 2015-10-17 MED ORDER — SODIUM CHLORIDE 0.9 % IV SOLN
Freq: Once | INTRAVENOUS | Status: AC
  Administered 2015-10-17: 11:00:00 via INTRAVENOUS

## 2015-10-17 MED ORDER — FLUMAZENIL 0.5 MG/5ML IV SOLN
0.2000 mg | Freq: Once | INTRAVENOUS | Status: AC
  Administered 2015-10-17: 0.2 mg via INTRAVENOUS
  Filled 2015-10-17: qty 5

## 2015-10-17 NOTE — Progress Notes (Signed)
*  PRELIMINARY RESULTS* Echocardiogram 2D Echocardiogram has been performed.  Courtney HousekeeperJerry R Hebert 10/17/2015, 2:49 PM
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
## 2015-10-17 NOTE — Discharge Instructions (Signed)
Accidental Overdose °A drug overdose occurs when a chemical substance (drug or medication) is used in amounts large enough to overcome a person. This may result in severe illness or death. This is a type of poisoning. Accidental overdoses of medications or other substances come from a variety of reasons. When this happens accidentally, it is often because the person taking the substance does not know enough about what they have taken. Drugs which commonly cause overdose deaths are alcohol, psychotropic medications (medications which affect the mind), pain medications, illegal drugs (street drugs) such as cocaine and heroin, and multiple drugs taken at the same time. It may result from careless behavior (such as over-indulging at a party). Other causes of overdose may include multiple drug use, a lapse in memory, or drug use after a period of no drug use.  °Sometimes overdosing occurs because a person cannot remember if they have taken their medication.  °A common unintentional overdose in young children involves multi-vitamins containing iron. Iron is a part of the hemoglobin molecule in blood. It is used to transport oxygen to living cells. When taken in small amounts, iron allows the body to restock hemoglobin. In large amounts, it causes problems in the body. If this overdose is not treated, it can lead to death. °Never take medicines that show signs of tampering or do not seem quite right. Never take medicines in the dark or in poor lighting. Read the label and check each dose of medicine before you take it. When adults are poisoned, it happens most often through carelessness or lack of information. Taking medicines in the dark or taking medicine prescribed for someone else to treat the same type of problem is a dangerous practice. °SYMPTOMS  °Symptoms of overdose depend on the medication and amount taken. They can vary from over-activity with stimulant over-dosage, to sleepiness from depressants such as  alcohol, narcotics and tranquilizers. Confusion, dizziness, nausea and vomiting may be present. If problems are severe enough coma and death may result. °DIAGNOSIS  °Diagnosis and management are generally straightforward if the drug is known. Otherwise it is more difficult. At times, certain symptoms and signs exhibited by the patient, or blood tests, can reveal the drug in question.  °TREATMENT  °In an emergency department, most patients can be treated with supportive measures. Antidotes may be available if there has been an overdose of opioids or benzodiazepines. A rapid improvement will often occur if this is the cause of overdose. °At home or away from medical care: °· There may be no immediate problems or warning signs in children. °· Not everything works well in all cases of poisoning. °· Take immediate action. Poisons may act quickly. °· If you think someone has swallowed medicine or a household product, and the person is unconscious, having seizures (convulsions), or is not breathing, immediately call for an ambulance. °IF a person is conscious and appears to be doing OK but has swallowed a poison: °· Do not wait to see what effect the poison will have. Immediately call a poison control center (listed in the white pages of your telephone book under "Poison Control" or inside the front cover with other emergency numbers). Some poison control centers have TTY capability for the deaf. Check with your local center if you or someone in your family requires this service. °· Keep the container so you can read the label on the product for ingredients. °· Describe what, when, and how much was taken and the age and condition of the person poisoned.   Inform them if the person is vomiting, choking, drowsy, shows a change in color or temperature of skin, is conscious or unconscious, or is convulsing.  Do not cause vomiting unless instructed by medical personnel. Do not induce vomiting or force liquids into a person who  is convulsing, unconscious, or very drowsy. Stay calm and in control.   Activated charcoal also is sometimes used in certain types of poisoning and you may wish to add a supply to your emergency medicines. It is available without a prescription. Call a poison control center before using this medication. PREVENTION  Thousands of children die every year from unintentional poisoning. This may be from household chemicals, poisoning from carbon monoxide in a car, taking their parent's medications, or simply taking a few iron pills or vitamins with iron. Poisoning comes from unexpected sources.  Store medicines out of the sight and reach of children, preferably in a locked cabinet. Do not keep medications in a food cabinet. Always store your medicines in a secure place. Get rid of expired medications.  If you have children living with you or have them as occasional guests, you should have child-resistant caps on your medicine containers. Keep everything out of reach. Child proof your home.  If you are called to the telephone or to answer the door while you are taking a medicine, take the container with you or put the medicine out of the reach of small children.  Do not take your medication in front of children. Do not tell your child how good a medication is and how good it is for them. They may get the idea it is more of a treat.  If you are an adult and have accidentally taken an overdose, you need to consider how this happened and what can be done to prevent it from happening again. If this was from a street drug or alcohol, determine if there is a problem that needs addressing. If you are not sure a problems exists, it is easy to talk to a professional and ask them if they think you have a problem. It is better to handle this problem in this way before it happens again and has a much worse consequence.   This information is not intended to replace advice given to you by your health care provider. Make  sure you discuss any questions you have with your health care provider.   Document Released: 07/31/2004 Document Revised: 06/07/2014 Document Reviewed: 11/04/2014 Elsevier Interactive Patient Education 2016 [employer]  Weakness Weakness is a lack of strength. It may be felt all over the body (generalized) or in one specific part of the body (focal). Some causes of weakness can be serious. You may need further medical evaluation, especially if you are elderly or you have a history of immunosuppression (such as chemotherapy or HIV), kidney disease, heart disease, or diabetes. CAUSES  Weakness can be caused by many different things, including:  Infection.  Physical exhaustion.  Internal bleeding or other blood loss that results in a lack of red blood cells (anemia).  Dehydration. This cause is more common in elderly people.  Side effects or electrolyte abnormalities from medicines, such as pain medicines or sedatives.  Emotional distress, anxiety, or depression.  Circulation problems, especially severe peripheral arterial disease.  Heart disease, such as rapid atrial fibrillation, bradycardia, or heart failure.  Nervous system disorders, such as Guillain-Barr syndrome, multiple sclerosis, or stroke. DIAGNOSIS  To find the cause of your weakness, your caregiver will take your history  and perform a physical exam. Lab tests or X-rays may also be ordered, if needed. TREATMENT  Treatment of weakness depends on the cause of your symptoms and can vary greatly. HOME CARE INSTRUCTIONS   Rest as needed.  Eat a well-balanced diet.  Try to get some exercise every day.  Only take over-the-counter or prescription medicines as directed by your caregiver. SEEK MEDICAL CARE IF:   Your weakness seems to be getting worse or spreads to other parts of your body.  You develop new aches or pains. SEEK IMMEDIATE MEDICAL CARE IF:   You cannot perform your normal daily activities, such as  getting dressed and feeding yourself.  You cannot walk up and down stairs, or you feel exhausted when you do so.  You have shortness of breath or chest pain.  You have difficulty moving parts of your body.  You have weakness in only one area of the body or on only one side of the body.  You have a fever.  You have trouble speaking or swallowing.  You cannot control your bladder or bowel movements.  You have black or bloody vomit or stools. MAKE SURE YOU:  Understand these instructions.  Will watch your condition.  Will get help right away if you are not doing well or get worse.   This information is not intended to replace advice given to you by your health care provider. Make sure you discuss any questions you have with your health care provider.   Document Released: 05/17/2005 Document Revised: 11/16/2011 Document Reviewed: 07/16/2011 Elsevier Interactive Patient Education [employer] Inc[employer]

## 2015-10-17 NOTE — ED Provider Notes (Addendum)
[facility] Emergency Department Provider Note        Time seen: ----------------------------------------- 10:07 AM on 10/17/2015 -----------------------------------------   I have reviewed the triage vital signs and the nursing notes.   HISTORY  Chief Complaint No chief complaint on file.    HPI Courtney Hebert is a 40 y.o. female who presents to ER being brought by EMS for weakness and nearly unresponsive state. Family states she could not get up, this is unusual for her. Patient does admit to me of taking benzodiazepines today. She states it was "not that much". Patient is brought in lethargic but denying complaints.   Past Medical History  Diagnosis Date  . Chronic back pain     Patient Active Problem List   Diagnosis Date Noted  . Bipolar affective disorder, currently depressed, moderate (HCC)   . Bipolar disorder (HCC) 10/06/2015  . Opiate dependence (HCC) 10/06/2015  . Methadone overdose 10/03/2015  . Polysubstance abuse 10/03/2015  . Acute hypoxemic respiratory failure (HCC) 10/03/2015  . Tobacco abuse 10/03/2015  . Migraine 10/03/2015    No past surgical history on file.  Allergies Vancomycin  Social History Social History  Substance Use Topics  . Smoking status: Current Every Day Smoker  . Smokeless tobacco: Not on file  . Alcohol Use: Yes    Review of Systems Constitutional: Negative for fever. Eyes: Negative for visual changes. ENT: Negative for sore throat. Cardiovascular: Negative for chest pain. Respiratory: Negative for shortness of breath. Gastrointestinal: Negative for abdominal pain, vomiting and diarrhea. Genitourinary: Negative for dysuria. Musculoskeletal: Negative for back pain. Skin: Negative for rash. Neurological: Negative for headaches, Positive for weakness  10-point ROS otherwise negative.  ____________________________________________   PHYSICAL EXAM:  VITAL SIGNS: ED Triage Vitals  Enc Vitals  Group     BP --      Pulse --      Resp --      Temp --      Temp src --      SpO2 --      Weight --      Height --      Head Cir --      Peak Flow --      Pain Score --      Pain Loc --      Pain Edu? --      Excl. in GC? --     Constitutional: Alert and oriented. Lethargic, no acute distress, disheveled appearance Eyes: Conjunctivae are normal. PERRL. Normal extraocular movements. ENT   Head: Normocephalic and atraumatic.   Nose: No congestion/rhinnorhea.   Mouth/Throat: Mucous membranes are moist.   Neck: No stridor. Cardiovascular: Normal rate, regular rhythm. No murmurs, rubs, or gallops. Respiratory: Normal respiratory effort without tachypnea nor retractions. Breath sounds are clear and equal bilaterally. No wheezes/rales/rhonchi. Gastrointestinal: Soft and nontender. Normal bowel sounds Musculoskeletal: Nontender with normal range of motion in all extremities. No lower extremity tenderness nor edema. Neurologic:  Normal speech and language. No gross focal neurologic deficits are appreciated.  Skin:  Skin is warm, dry and intact. No rash noted. Psychiatric: Mood and affect are normal. Speech and behavior are normal.  ____________________________________________  EKG: Interpreted by me.Sinus tachycardia with rate 103 bpm, normal PR interval, normal QRS, long QT interval. Normal axis.  ____________________________________________  ED COURSE:  Pertinent labs & imaging results that were available during my care of the patient were reviewed by me and considered in my medical decision making (see chart for details).  Patient resents to ER after ingestion of unknown benzodiazepines. We'll check basic labs and give flumazenil. ____________________________________________    LABS (pertinent positives/negatives)  Labs Reviewed  CBC WITH DIFFERENTIAL/PLATELET - Abnormal; Notable for the following:    WBC 15.9 (*)    RBC 5.69 (*)    Hemoglobin 16.2 (*)    HCT  49.0 (*)    RDW 17.0 (*)    Neutro Abs 13.8 (*)    All other components within normal limits  COMPREHENSIVE METABOLIC PANEL - Abnormal; Notable for the following:    Glucose, Bld 110 (*)    BUN 26 (*)    Creatinine, Ser 1.36 (*)    Total Protein 8.4 (*)    AST 61 (*)    GFR calc non Af Amer 48 (*)    GFR calc Af Amer 55 (*)    All other components within normal limits  TROPONIN I - Abnormal; Notable for the following:    Troponin I 0.05 (*)    All other components within normal limits  URINALYSIS COMPLETEWITH MICROSCOPIC ([facility] ONLY) - Abnormal; Notable for the following:    Color, Urine YELLOW (*)    APPearance TURBID (*)    Ketones, ur TRACE (*)    Protein, ur 30 (*)    Bacteria, UA RARE (*)    Squamous Epithelial / LPF 0-5 (*)    All other components within normal limits  URINE DRUG SCREEN, QUALITATIVE ([facility] ONLY) - Abnormal; Notable for the following:    Tricyclic, Ur Screen POSITIVE (*)    Benzodiazepine, Ur Scrn POSITIVE (*)    Methadone Scn, Ur POSITIVE (*)    All other components within normal limits  BLOOD GAS, VENOUS - Abnormal; Notable for the following:    pO2, Ven 48.0 (*)    All other components within normal limits  TROPONIN I - Abnormal; Notable for the following:    Troponin I 0.05 (*)    All other components within normal limits  AMMONIA  ETHANOL   ____________________________________________  FINAL ASSESSMENT AND PLAN  Altered mental status, benzodiazepine ingestion  Plan: Patient with labs and imaging as dictated above. Troponins appears stable. I have ordered an echocardiogram as she has a long history of IV drug abuse. Initially this has been read as grossly unremarkable with no valvular lesions. Patient has a positive drug screen for tricyclic antidepressants, benzodiazepines and methadone which is likely the cause for her altered mental status. Patient has been discussed with Dr. Juliann Pares who recommends outpatient follow-up.   Emily Filbert, MD   Note: This dictation was prepared with Dragon dictation. Any transcriptional errors that result from this process are unintentional   Emily Filbert, MD 10/17/15 1533  Emily Filbert, MD 10/17/15 [phone]

## 2015-11-01 ENCOUNTER — Encounter: Payer: Self-pay | Admitting: Radiology

## 2015-11-01 ENCOUNTER — Emergency Department: Payer: Self-pay

## 2015-11-01 ENCOUNTER — Inpatient Hospital Stay
Admission: EM | Admit: 2015-11-01 | Discharge: 2015-11-04 | DRG: 208 | Disposition: A | Discharge status: Other Acute Inpatient Hospital | Payer: Self-pay | Attending: Internal Medicine | Admitting: Internal Medicine

## 2015-11-01 DIAGNOSIS — Z7901 Long term (current) use of anticoagulants: Secondary | ICD-10-CM

## 2015-11-01 DIAGNOSIS — F419 Anxiety disorder, unspecified: Secondary | ICD-10-CM | POA: Diagnosis present

## 2015-11-01 DIAGNOSIS — Z046 Encounter for general psychiatric examination, requested by authority: Secondary | ICD-10-CM

## 2015-11-01 DIAGNOSIS — N179 Acute kidney failure, unspecified: Secondary | ICD-10-CM | POA: Diagnosis present

## 2015-11-01 DIAGNOSIS — R45851 Suicidal ideations: Secondary | ICD-10-CM | POA: Diagnosis present

## 2015-11-01 DIAGNOSIS — G9340 Encephalopathy, unspecified: Secondary | ICD-10-CM | POA: Diagnosis present

## 2015-11-01 DIAGNOSIS — T40601A Poisoning by unspecified narcotics, accidental (unintentional), initial encounter: Secondary | ICD-10-CM | POA: Diagnosis present

## 2015-11-01 DIAGNOSIS — Z86718 Personal history of other venous thrombosis and embolism: Secondary | ICD-10-CM

## 2015-11-01 DIAGNOSIS — I2699 Other pulmonary embolism without acute cor pulmonale: Principal | ICD-10-CM | POA: Diagnosis present

## 2015-11-01 DIAGNOSIS — F3132 Bipolar disorder, current episode depressed, moderate: Secondary | ICD-10-CM | POA: Diagnosis present

## 2015-11-01 DIAGNOSIS — G8929 Other chronic pain: Secondary | ICD-10-CM | POA: Diagnosis present

## 2015-11-01 DIAGNOSIS — M549 Dorsalgia, unspecified: Secondary | ICD-10-CM | POA: Diagnosis present

## 2015-11-01 DIAGNOSIS — F1120 Opioid dependence, uncomplicated: Secondary | ICD-10-CM | POA: Diagnosis present

## 2015-11-01 DIAGNOSIS — J9600 Acute respiratory failure, unspecified whether with hypoxia or hypercapnia: Secondary | ICD-10-CM | POA: Diagnosis present

## 2015-11-01 DIAGNOSIS — F17210 Nicotine dependence, cigarettes, uncomplicated: Secondary | ICD-10-CM | POA: Diagnosis present

## 2015-11-01 DIAGNOSIS — F1320 Sedative, hypnotic or anxiolytic dependence, uncomplicated: Secondary | ICD-10-CM

## 2015-11-01 DIAGNOSIS — R4182 Altered mental status, unspecified: Secondary | ICD-10-CM

## 2015-11-01 DIAGNOSIS — Z79899 Other long term (current) drug therapy: Secondary | ICD-10-CM

## 2015-11-01 LAB — GLUCOSE, CAPILLARY
GLUCOSE-CAPILLARY: 76 mg/dL (ref 65–99)
GLUCOSE-CAPILLARY: 80 mg/dL (ref 65–99)
Glucose-Capillary: 86 mg/dL (ref 65–99)

## 2015-11-01 LAB — BLOOD GAS, ARTERIAL
ALLENS TEST (PASS/FAIL): POSITIVE — AB
Acid-base deficit: 6 mmol/L — ABNORMAL HIGH (ref 0.0–2.0)
Bicarbonate: 21.9 mEq/L (ref 21.0–28.0)
FIO2: 0.5
MECHANICAL RATE: 20
O2 Saturation: 94.2 %
PATIENT TEMPERATURE: 37
PCO2 ART: 51 mmHg — AB (ref 32.0–48.0)
PEEP: 5 cmH2O
RATE: 20 resp/min
VT: 500 mL
pH, Arterial: 7.24 — ABNORMAL LOW (ref 7.350–7.450)
pO2, Arterial: 84 mmHg (ref 83.0–108.0)

## 2015-11-01 LAB — URINALYSIS COMPLETE WITH MICROSCOPIC (ARMC ONLY)
BACTERIA UA: NONE SEEN
BILIRUBIN URINE: NEGATIVE
Glucose, UA: 50 mg/dL — AB
Hgb urine dipstick: NEGATIVE
KETONES UR: NEGATIVE mg/dL
LEUKOCYTES UA: NEGATIVE
NITRITE: NEGATIVE
PROTEIN: 100 mg/dL — AB
SPECIFIC GRAVITY, URINE: 1.013 (ref 1.005–1.030)
pH: 6 (ref 5.0–8.0)

## 2015-11-01 LAB — CBC
HEMATOCRIT: 45.2 % (ref 35.0–47.0)
HEMOGLOBIN: 14.8 g/dL (ref 12.0–16.0)
MCH: 28.9 pg (ref 26.0–34.0)
MCHC: 32.7 g/dL (ref 32.0–36.0)
MCV: 88.4 fL (ref 80.0–100.0)
PLATELETS: 281 10*3/uL (ref 150–440)
RBC: 5.11 MIL/uL (ref 3.80–5.20)
RDW: 16.7 % — ABNORMAL HIGH (ref 11.5–14.5)
WBC: 12.9 10*3/uL — AB (ref 3.6–11.0)

## 2015-11-01 LAB — CBC WITH DIFFERENTIAL/PLATELET
BASOS ABS: 0.1 10*3/uL (ref 0–0.1)
Basophils Relative: 1 %
Eosinophils Absolute: 0.3 10*3/uL (ref 0–0.7)
HEMATOCRIT: 47.1 % — AB (ref 35.0–47.0)
Hemoglobin: 15.3 g/dL (ref 12.0–16.0)
Lymphs Abs: 2.9 10*3/uL (ref 1.0–3.6)
MCH: 28.2 pg (ref 26.0–34.0)
MCHC: 32.5 g/dL (ref 32.0–36.0)
MCV: 86.6 fL (ref 80.0–100.0)
MONO ABS: 0.4 10*3/uL (ref 0.2–0.9)
NEUTROS ABS: 5.4 10*3/uL (ref 1.4–6.5)
Neutrophils Relative %: 59 %
PLATELETS: 328 10*3/uL (ref 150–440)
RBC: 5.44 MIL/uL — ABNORMAL HIGH (ref 3.80–5.20)
RDW: 16.9 % — AB (ref 11.5–14.5)
WBC: 9 10*3/uL (ref 3.6–11.0)

## 2015-11-01 LAB — HEPARIN LEVEL (UNFRACTIONATED)
Heparin Unfractionated: 0.51 [IU]/mL (ref 0.30–0.70)
Heparin Unfractionated: 0.61 IU/mL (ref 0.30–0.70)

## 2015-11-01 LAB — COMPREHENSIVE METABOLIC PANEL
ALT: 24 U/L (ref 14–54)
ANION GAP: 9 (ref 5–15)
AST: 31 U/L (ref 15–41)
Albumin: 3.6 g/dL (ref 3.5–5.0)
Alkaline Phosphatase: 76 U/L (ref 38–126)
BILIRUBIN TOTAL: 0.2 mg/dL — AB (ref 0.3–1.2)
BUN: 12 mg/dL (ref 6–20)
CALCIUM: 8.3 mg/dL — AB (ref 8.9–10.3)
CHLORIDE: 107 mmol/L (ref 101–111)
CO2: 21 mmol/L — ABNORMAL LOW (ref 22–32)
Creatinine, Ser: 1.07 mg/dL — ABNORMAL HIGH (ref 0.44–1.00)
Glucose, Bld: 208 mg/dL — ABNORMAL HIGH (ref 65–99)
POTASSIUM: 3.6 mmol/L (ref 3.5–5.1)
Sodium: 137 mmol/L (ref 135–145)
TOTAL PROTEIN: 7.3 g/dL (ref 6.5–8.1)

## 2015-11-01 LAB — BASIC METABOLIC PANEL
Anion gap: 6 (ref 5–15)
BUN: 12 mg/dL (ref 6–20)
CALCIUM: 8 mg/dL — AB (ref 8.9–10.3)
CO2: 20 mmol/L — ABNORMAL LOW (ref 22–32)
CREATININE: 0.82 mg/dL (ref 0.44–1.00)
Chloride: 112 mmol/L — ABNORMAL HIGH (ref 101–111)
GFR calc Af Amer: >60 mL/min (ref >60)
GLUCOSE: 85 mg/dL (ref 65–99)
POTASSIUM: 5 mmol/L (ref 3.5–5.1)
SODIUM: 138 mmol/L (ref 135–145)

## 2015-11-01 LAB — URINE DRUG SCREEN, QUALITATIVE (ARMC ONLY)
Amphetamines, Ur Screen: NOT DETECTED
BENZODIAZEPINE, UR SCRN: POSITIVE — AB
Barbiturates, Ur Screen: NOT DETECTED
CANNABINOID 50 NG, UR ~~LOC~~: NOT DETECTED
Cocaine Metabolite,Ur ~~LOC~~: NOT DETECTED
MDMA (Ecstasy)Ur Screen: NOT DETECTED
Methadone Scn, Ur: NOT DETECTED
Opiate, Ur Screen: NOT DETECTED
PHENCYCLIDINE (PCP) UR S: NOT DETECTED
Tricyclic, Ur Screen: NOT DETECTED

## 2015-11-01 LAB — SALICYLATE LEVEL

## 2015-11-01 LAB — TRIGLYCERIDES: Triglycerides: 244 mg/dL — ABNORMAL HIGH (ref <150)

## 2015-11-01 LAB — TROPONIN I

## 2015-11-01 LAB — PHOSPHORUS: Phosphorus: 3.9 mg/dL (ref 2.5–4.6)

## 2015-11-01 LAB — ACETAMINOPHEN LEVEL

## 2015-11-01 LAB — LITHIUM LEVEL: Lithium Lvl: <0.06 mmol/L — ABNORMAL LOW (ref 0.60–1.20)

## 2015-11-01 LAB — MAGNESIUM: Magnesium: 1.9 mg/dL (ref 1.7–2.4)

## 2015-11-01 LAB — ETHANOL

## 2015-11-01 LAB — LACTIC ACID, PLASMA
LACTIC ACID, VENOUS: 2.4 mmol/L — AB (ref 0.5–2.0)
LACTIC ACID, VENOUS: 1.1 mmol/L (ref 0.5–2.0)

## 2015-11-01 LAB — PROTIME-INR
INR: 1.06
Prothrombin Time: 14 seconds (ref 11.4–15.0)

## 2015-11-01 MED ORDER — ROCURONIUM BROMIDE 50 MG/5ML IV SOLN
100.0000 mg | Freq: Once | INTRAVENOUS | Status: AC
  Administered 2015-11-01: 100 mg via INTRAVENOUS

## 2015-11-01 MED ORDER — PANTOPRAZOLE SODIUM 40 MG PO PACK
40.0000 mg | PACK | Freq: Every day | ORAL | Status: DC
  Administered 2015-11-01 – 2015-11-03 (×2): 40 mg
  Filled 2015-11-01 (×2): qty 20

## 2015-11-01 MED ORDER — HEPARIN (PORCINE) IN NACL 100-0.45 UNIT/ML-% IJ SOLN
1500.0000 [IU]/h | INTRAMUSCULAR | Status: DC
  Administered 2015-11-01 – 2015-11-02 (×2): 1500 [IU]/h via INTRAVENOUS
  Filled 2015-11-01 (×4): qty 250

## 2015-11-01 MED ORDER — PROPOFOL 1000 MG/100ML IV EMUL
INTRAVENOUS | Status: AC
  Administered 2015-11-01: 5 ug/kg/min via INTRAVENOUS
  Filled 2015-11-01: qty 100

## 2015-11-01 MED ORDER — NALOXONE HCL 2 MG/2ML IJ SOSY
2.0000 mg | PREFILLED_SYRINGE | Freq: Once | INTRAMUSCULAR | Status: AC
  Administered 2015-11-01: 2 mg via INTRAVENOUS

## 2015-11-01 MED ORDER — PROPOFOL 1000 MG/100ML IV EMUL
5.0000 ug/kg/min | Freq: Once | INTRAVENOUS | Status: AC
  Administered 2015-11-01: 5 ug/kg/min via INTRAVENOUS

## 2015-11-01 MED ORDER — PRO-STAT SUGAR FREE PO LIQD
30.0000 mL | Freq: Two times a day (BID) | ORAL | Status: DC
  Administered 2015-11-01: 30 mL

## 2015-11-01 MED ORDER — SODIUM CHLORIDE 0.9 % IV BOLUS (SEPSIS)
1000.0000 mL | Freq: Once | INTRAVENOUS | Status: AC
  Administered 2015-11-01: 1000 mL via INTRAVENOUS

## 2015-11-01 MED ORDER — NALOXONE HCL 2 MG/2ML IJ SOSY
1.0000 mg | PREFILLED_SYRINGE | Freq: Once | INTRAMUSCULAR | Status: AC
  Administered 2015-11-01: 1 mg via INTRAVENOUS

## 2015-11-01 MED ORDER — SODIUM CHLORIDE 0.9 % IV SOLN: 250.0000 mL | INTRAVENOUS | Status: DC | PRN

## 2015-11-01 MED ORDER — HEPARIN BOLUS VIA INFUSION
4000.0000 [IU] | Freq: Once | INTRAVENOUS | Status: AC
  Administered 2015-11-01: 4000 [IU] via INTRAVENOUS
  Filled 2015-11-01: qty 4000

## 2015-11-01 MED ORDER — NALOXONE HCL 2 MG/2ML IJ SOSY
2.0000 mg | PREFILLED_SYRINGE | Freq: Once | INTRAMUSCULAR | Status: AC
  Administered 2015-11-01: 2 mg via INTRAMUSCULAR

## 2015-11-01 MED ORDER — ONDANSETRON HCL 4 MG/2ML IJ SOLN: 4.0000 mg | Freq: Four times a day (QID) | INTRAMUSCULAR | Status: DC | PRN

## 2015-11-01 MED ORDER — ASPIRIN 300 MG RE SUPP: 300.0000 mg | RECTAL | Status: AC

## 2015-11-01 MED ORDER — ASPIRIN 81 MG PO CHEW
324.0000 mg | CHEWABLE_TABLET | ORAL | Status: AC
  Administered 2015-11-01: 324 mg via ORAL
  Filled 2015-11-01: qty 4

## 2015-11-01 MED ORDER — VITAL HIGH PROTEIN PO LIQD
1000.0000 mL | ORAL | Status: DC
  Administered 2015-11-01: 1000 mL

## 2015-11-01 MED ORDER — ACETAMINOPHEN 325 MG PO TABS
650.0000 mg | ORAL_TABLET | ORAL | Status: DC | PRN
  Administered 2015-11-02: 650 mg via ORAL
  Filled 2015-11-01: qty 2

## 2015-11-01 MED ORDER — PROPOFOL 1000 MG/100ML IV EMUL
5.0000 ug/kg/min | INTRAVENOUS | Status: DC
  Administered 2015-11-02: 8 ug/kg/min via INTRAVENOUS
  Administered 2015-11-02: 5 ug/kg/min via INTRAVENOUS
  Filled 2015-11-01 (×2): qty 100

## 2015-11-01 MED ORDER — IOPAMIDOL (ISOVUE-370) INJECTION 76%
75.0000 mL | Freq: Once | INTRAVENOUS | Status: AC | PRN
  Administered 2015-11-01: 75 mL via INTRAVENOUS

## 2015-11-01 NOTE — H&P (Signed)
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   PULMONARY A:Acute pulmonary emboli. Syncopal episode secondary to above. Acute respiratory failure. P:   -Patient has been started on anticoagulation. -Case was reviewed with interventional radiology, she was not felt to be a candidate for catheter directed thrombolytics, due to lack of significantly large clot burden and lactose evidence of RV strain. -We'll obtain echocardiogram. -The patient's home medication list includes warfarin, however, her INR was normal at the time of admission.  CARDIOVASCULAR A: Syncopal episode.  P:  Will monitor blood pressure, start IV fluids as needed. Echo tech declined to come in today, echo would only be done on Monday.   RENAL A:  Acute kidney injury. P:   Continue IV fluids.  GASTROINTESTINAL A:  GI prophylaxis. Start tube feeds.   HEMATOLOGIC A:  Monitor CBC. Check lower extremity Dopplers. P:    INFECTIOUS A: -  Micro/culture results:  BCx2 -- UC -- Sputum--  Antibiotics:   ENDOCRINE A:  Will monitor blood glucose levels. P:     NEUROLOGIC A: Patient is currently minimally responsive. P:  She does have spontaneous movements on propofol drip and required a bolus, therefore she is not a candidate for TH.     MAJOR EVENTS/TEST RESULTS:   Best Practices  DVT Prophylaxis: on heparin.  GI Prophylaxis: protonix.    ---------------------------------------  ---------------------------------------   Name: Courtney Hebert MRN: [id number] DOB: 08-21-1975    ADMISSION DATE:  11/01/2015   CHIEF COMPLAINT:  Unresponsive   HISTORY OF PRESENT ILLNESS:   The patient is currently intubated on a ventilator. Therefore, cannot provide history or review of systems. The patient for history has suicidal ideation in the past, she also has a history of substance abuse.  Per charting. The patient's husband notes that she was considering taking her life. In discussing the patient with daughter and son-in-law at bedside, it was noted that the patient was with family, and was visualized and communicating just before the episode, and she appeared to be in her usual state of health that time. She has no particular complaints other than stabbing pain in her legs, her chronic issues with back pain. They then heard a loud thump of her falling, when they came into the room. They found her fallen against the nightstand. She was unresponsive and she was turning blue. They tried to wake her for a few seconds to 1 minute and she began to breathe on her road with snoring respirations. She was not responsive at that time, nor had she been responsive after that.  Patient was sent for CT of the chest and head, CT of the head was unremarkable. CT of the chest, which I reviewed, images, showed left lower lobe filling defects consistent with pulmonary emboli.  PAST MEDICAL HISTORY :  Past Medical History  Diagnosis Date  . Chronic back pain   . Vascular disease   . DVT (deep venous thrombosis) (HCC)   . Bipolar 1 disorder (HCC)    No past surgical history on file. Prior to Admission medications   Medication Sig Start Date End Date Taking? Authorizing Provider  doxepin (SINEQUAN) 10 MG capsule Take 10-20 mg by mouth at bedtime as needed (for sleep).   Yes Historical Provider, MD  hydrochlorothiazide (HYDRODIURIL) 25 MG tablet Take 25 mg by mouth daily.   Yes Historical Provider, MD  lithium carbonate  300 MG capsule Take 300 mg by mouth daily.   Yes Historical Provider, MD  tiZANidine (ZANAFLEX) 2 MG tablet Take 2 mg by mouth 2 (two) times daily.   Yes Historical Provider, MD  traZODone (DESYREL) 150 MG tablet Take 150 mg by mouth at bedtime.   Yes Historical Provider, MD  warfarin (COUMADIN) 5 MG tablet Take 5 mg by mouth daily.   Yes Historical Provider, MD  ondansetron (ZOFRAN) 4  MG tablet Take 1 tablet (4 mg total) by mouth every 8 (eight) hours as needed for nausea or vomiting. 10/08/15   Houston Siren, MD   Allergies  Allergen Reactions  . Vancomycin Anaphylaxis    FAMILY HISTORY:  No family history on file. SOCIAL HISTORY:  reports that she has been smoking Cigarettes.  She has been smoking about 1.00 pack per day. She does not have any smokeless tobacco history on file. She reports that she drinks alcohol. She reports that she uses illicit drugs (IV).  REVIEW OF SYSTEMS:   The patient cannot currently provide assistance as she is intubated on a ventilator.   VITAL SIGNS: Temp:  [96.6 F (35.9 C)-96.8 F (36 C)] 96.6 F (35.9 C) (06/03 1520) Pulse Rate:  [52-123] 56 (06/03 1520) Resp:  [0-34] 0 (06/03 1230) BP: (80-213)/(37-130) 102/67 mmHg (06/03 1520) SpO2:  [88 %-100 %] 98 % (06/03 1520) FiO2 (%):  [50 %] 50 % (06/03 1432) Weight:  [302 lb 0.5 oz (137 kg)] 302 lb 0.5 oz (137 kg) (06/03 1134) HEMODYNAMICS:   VENTILATOR SETTINGS: Vent Mode:  [-] AC FiO2 (%):  [50 %] 50 % Set Rate:  [12 bmp-20 bmp] 12 bmp Vt Set:  [500 mL] 500 mL PEEP:  [5 cmH20] 5 cmH20 INTAKE / OUTPUT: No intake or output data in the 24 hours ending 11/01/15 1524  Physical Examination:   VS: BP 102/67 mmHg  Pulse 56  Temp(Src) 96.6 F (35.9 C)  Resp 0  Wt 302 lb 0.5 oz (137 kg)  SpO2 98%  LMP  (LMP Unknown)  General Appearance: No distress  Neuro:without focal findings, mental status, unresponsive HEENT: PERRLA, sluggish no ptosis, no other lesions noticed;  Pulmonary: normal breath sounds., diaphragmatic excursion normal. CardiovascularNormal S1,S2.  No m/r/g.    Abdomen: Benign, Soft, non-tender, No masses, hepatosplenomegaly, No lymphadenopathy Renal:  No costovertebral tenderness  GU:  Not performed at this time. Endoc: No evident thyromegaly, no signs of acromegaly. Skin:   warm, no rashes, no ecchymosis , multiple circular rashes/scars throughout  body Extremities: normal, no cyanosis, clubbing, no edema, warm with normal capillary refill.    LABS: Reviewed   LABORATORY PANEL:   CBC  Recent Labs Lab 11/01/15 1202  WBC 9.0  HGB 15.3  HCT 47.1*  PLT 328    Chemistries   Recent Labs Lab 11/01/15 1202  NA 137  K 3.6  CL 107  CO2 21*  GLUCOSE 208*  BUN 12  CREATININE 1.07*  CALCIUM 8.3*  AST 31  ALT 24  ALKPHOS 76  BILITOT 0.2*    No results for input(s): GLUCAP in the last 168 hours.  Recent Labs Lab 11/01/15 1207  PHART 7.24*  PCO2ART 51*  PO2ART 84    Recent Labs Lab 11/01/15 1202  AST 31  ALT 24  ALKPHOS 76  BILITOT 0.2*  ALBUMIN 3.6    Cardiac Enzymes  Recent Labs Lab 11/01/15 1202  TROPONINI <0.03    RADIOLOGY:  Dg Pelvis 1-2 Views  11/01/2015  CLINICAL  DATA:  Status post intubation.  Unresponsive. EXAM: PELVIS - 1-2 VIEW COMPARISON:  Abdominal radiographs 10/04/2015 and 06/17/2013 FINDINGS: No fracture is identified. Femoral heads are approximated with the acetabula on this single AP projection, with mild bilateral hip joint spurring noted. A monitoring lead overlies the bladder. IMPRESSION: No acute osseous abnormality identified. Electronically Signed   By: Sebastian AcheAllen  Grady M.D.   On: 11/01/2015 12:48   Ct Head Wo Contrast  11/01/2015  CLINICAL DATA:  Found unresponsive.  Complains of lower leg pain. EXAM: CT HEAD WITHOUT CONTRAST CT CERVICAL SPINE WITHOUT CONTRAST TECHNIQUE: Multidetector CT imaging of the head and cervical spine was performed following the standard protocol without intravenous contrast. Multiplanar CT image reconstructions of the cervical spine were also generated. COMPARISON:  None. FINDINGS: CT HEAD FINDINGS No acute cortical infarct, hemorrhage, or mass lesion ispresent. Ventricles are of normal size. No significant extra-axial fluid collection is present. The paranasal sinuses andmastoid air cells are clear. The osseous skull is intact. Mild mucosal thickening  involving the left sphenoid sinus noted. There is a chronic fracture deformity involving the medial wall of the right orbit. CT CERVICAL SPINE FINDINGS Normal alignment of the cervical spine. The vertebral body heights are well preserved. The facet joints are well-aligned. The prevertebral soft tissue space is normal. No acute fracture or subluxation identified. There is no fracture or subluxation identified. IMPRESSION: 1. No acute intracranial abnormality. 2. No evidence for cervical spine fracture or subluxation. Electronically Signed   By: Signa Kellaylor  Stroud M.D.   On: 11/01/2015 13:23   Ct Angio Chest Pe W/cm &/or Wo Cm  11/01/2015  CLINICAL DATA:  Found unresponsive.  Tachycardia. EXAM: CT ANGIOGRAPHY CHEST WITH CONTRAST TECHNIQUE: Multidetector CT imaging of the chest was performed using the standard protocol during bolus administration of intravenous contrast. Multiplanar CT image reconstructions and MIPs were obtained to evaluate the vascular anatomy. CONTRAST:  75 cc Isovue 370 IV COMPARISON:  11/01/2015 FINDINGS: Mediastinum/Nodes: Heart is borderline enlarged. Aorta is normal caliber. No dissection. Small scattered mediastinal lymph nodes. No mediastinal, hilar, or axillary adenopathy. There are filling defects within the left lower lobe pulmonary artery extending into segmental branches posteriorly compatible with pulmonary emboli. Probable small pulmonary emboli in the upper lobe of the right lung. No evidence of right heart strain. Lungs/Pleura: Endotracheal tube in place with the tip just above the carina. Areas of atelectasis in the lower lobes bilaterally. Probable atelectasis posteriorly and medially in the right upper lobe. Mild ground-glass opacities and interlobular septal thickening may reflect interstitial edema. No visible effusions. Upper abdomen: Imaging into the upper abdomen shows no acute findings. NG tube in the stomach. Musculoskeletal: Chest wall soft tissues are unremarkable. No acute  bony abnormality. Review of the MIP images confirms the above findings. IMPRESSION: Pulmonary emboli involving the left lower lobe and likely small pulmonary emboli in the right upper lobe. Borderline cardiomegaly. Areas of ground-glass opacities and interlobular septal thickening could reflect early interstitial edema. Airspace opacities in the lower lobes likely reflect atelectasis. Airspace opacity in the right upper lobe could reflect atelectasis or infiltrate. Endotracheal tube tip just above the carina. Critical Value/emergent results were called by telephone at the time of interpretation on 11/01/2015 at 1:23 pm to Dr. Gladstone PihAVID SCHAEVITZ , who verbally acknowledged these results. Electronically Signed   By: Charlett NoseKevin  Dover M.D.   On: 11/01/2015 13:25   Ct Cervical Spine Wo Contrast  11/01/2015  CLINICAL DATA:  Found unresponsive.  Complains of lower leg pain. EXAM: CT  HEAD WITHOUT CONTRAST CT CERVICAL SPINE WITHOUT CONTRAST TECHNIQUE: Multidetector CT imaging of the head and cervical spine was performed following the standard protocol without intravenous contrast. Multiplanar CT image reconstructions of the cervical spine were also generated. COMPARISON:  None. FINDINGS: CT HEAD FINDINGS No acute cortical infarct, hemorrhage, or mass lesion ispresent. Ventricles are of normal size. No significant extra-axial fluid collection is present. The paranasal sinuses andmastoid air cells are clear. The osseous skull is intact. Mild mucosal thickening involving the left sphenoid sinus noted. There is a chronic fracture deformity involving the medial wall of the right orbit. CT CERVICAL SPINE FINDINGS Normal alignment of the cervical spine. The vertebral body heights are well preserved. The facet joints are well-aligned. The prevertebral soft tissue space is normal. No acute fracture or subluxation identified. There is no fracture or subluxation identified. IMPRESSION: 1. No acute intracranial abnormality. 2. No evidence for  cervical spine fracture or subluxation. Electronically Signed   By: Signa Kell M.D.   On: 11/01/2015 13:23   Dg Chest Portable 1 View  11/01/2015  CLINICAL DATA:  Post intubation. EXAM: PORTABLE CHEST 1 VIEW COMPARISON:  10/04/2015 FINDINGS: Interval removal of right central line. NG tube and endotracheal tube are in place, unchanged. Improving aeration in the lungs. Minimal residual left base atelectasis. No confluent opacity on the right. Heart is normal size. IMPRESSION: Improving aeration in the lungs. Minimal residual left base atelectasis. Electronically Signed   By: Charlett Nose M.D.   On: 11/01/2015 12:42       --Wells Guiles, MD.  Board Certified in Internal Medicine, Pulmonary Medicine, Critical Care Medicine, and Sleep Medicine.  ICU Pager [phone] [facility] Office Number: [phone]  Santiago Glad, M.D.  Stephanie Acre, M.D.  Billy Fischer, M.D   11/01/2015, 3:24 PM

## 2015-11-01 NOTE — ED Notes (Signed)
Dr. Schaevitz at bedside preparing to intubate patient  

## 2015-11-01 NOTE — ED Provider Notes (Addendum)
[facility] Emergency Department Provider Note   ____________________________________________  Time seen: Seen upon arrival to the emergency department  I have reviewed the triage vital signs and the nursing notes.   HISTORY  Chief Complaint unresponsive    HPI Courtney Hebert is a 40 y.o. female with a known history of polysubstance abuse and overdoses who is presenting to the emergency department today after being found down at home. I discussed with her husband at 12:30 PM that he was testing with her at about 9:38 AM this morning. He says that she was considering suicide because she cannot take her back pain anymore. She was talking about either slitting her wrists are using heroin again.  En route, EMS back to the patient was not able to start an IV line. She is known to the staff here in the emergency department and she has been an access issue in the past. She was given 2 mg of intranasal Narcan in the field without improvement.   Past Medical History  Diagnosis Date  . Chronic back pain   . Vascular disease   . DVT (deep venous thrombosis) (HCC)   . Bipolar 1 disorder [facility] [place](HCC)     Patient Active Problem List   Diagnosis Date Noted  . Bipolar affective disorder, currently depressed, moderate (HCC)   . Bipolar disorder (HCC) 10/06/2015  . Opiate dependence (HCC) 10/06/2015  . Methadone overdose 10/03/2015  . Polysubstance abuse 10/03/2015  . Acute hypoxemic respiratory failure (HCC) 10/03/2015  . Tobacco abuse 10/03/2015  . Migraine 10/03/2015    No past surgical history on file.  Current Outpatient Rx  Name  Route  Sig  Dispense  Refill  . doxepin (SINEQUAN) 10 MG capsule   Oral   Take 10-20 mg by mouth at bedtime as needed (for sleep).         . hydrochlorothiazide (HYDRODIURIL) 25 MG tablet   Oral   Take 25 mg by mouth daily.         Marland Kitchen. lithium carbonate 300 MG capsule   Oral   Take 300 mg by mouth daily.         .  ondansetron (ZOFRAN) 4 MG tablet   Oral   Take 1 tablet (4 mg total) by mouth every 8 (eight) hours as needed for nausea or vomiting.   20 tablet   0   . tiZANidine (ZANAFLEX) 2 MG tablet   Oral   Take 2 mg by mouth 2 (two) times daily.         . traZODone (DESYREL) 150 MG tablet   Oral   Take 150 mg by mouth at bedtime.         Marland Kitchen. warfarin (COUMADIN) 5 MG tablet   Oral   Take 5 mg by mouth daily.           Allergies Vancomycin  No family history on file.  Social History Social History  Substance Use Topics  . Smoking status: Current Every Day Smoker -- 1.00 packs/day    Types: Cigarettes  . Smokeless tobacco: Not on file  . Alcohol Use: Yes    Review of Systems Caveat secondary to patient being unresponsive. ____________________________________________   PHYSICAL EXAM:  VITAL SIGNS: ED Triage Vitals  Enc Vitals Group     BP 11/01/15 1134 80/37 mmHg     Pulse Rate 11/01/15 1134 108     Resp 11/01/15 1135 34     Temp --  Temp src --      SpO2 11/01/15 1134 99 %     Weight 11/01/15 1134 302 lb 0.5 oz (137 kg)     Height --      Head Cir --      Peak Flow --      Pain Score --      Pain Loc --      Pain Edu? --      Excl. in GC? --     Constitutional: Initial GCS of 3. Eyes: Conjunctivae are normal. PERRL.  Head: Atraumatic. Nose: No congestion/rhinnorhea. Mouth/Throat: Mucous membranes are moist.  Oropharynx non-erythematous. Neck: No stridor.   Cardiovascular: Tachycardic, regular rhythm. Grossly normal heart sounds.  Good peripheral circulation with intact radial pulses. Respiratory: Snoring respirations. Lungs clear to auscultation bilaterally. Gastrointestinal: Soft. No distention.  Musculoskeletal: No lower extremity tenderness nor edema.  No joint effusions. Neurologic:  GCS of 3. Skin:  Multiple scabbed over lesions as well as scar tissue, presumably from IV drug use. Psychiatric: Mood and affect are normal. Speech and behavior are  normal.  ____________________________________________   LABS (all labs ordered are listed, but only abnormal results are displayed)  Labs Reviewed  URINALYSIS COMPLETEWITH MICROSCOPIC ([facility] ONLY) - Abnormal; Notable for the following:    Color, Urine YELLOW (*)    APPearance CLEAR (*)    Glucose, UA 50 (*)    Protein, ur 100 (*)    Squamous Epithelial / LPF 0-5 (*)    All other components within normal limits  BLOOD GAS, ARTERIAL - Abnormal; Notable for the following:    pH, Arterial 7.24 (*)    pCO2 arterial 51 (*)    Acid-base deficit 6.0 (*)    Allens test (pass/fail) POSITIVE (*)    All other components within normal limits  CULTURE, BLOOD (ROUTINE X 2)  CULTURE, BLOOD (ROUTINE X 2)  CBC WITH DIFFERENTIAL/PLATELET  COMPREHENSIVE METABOLIC PANEL  TROPONIN I  LACTIC ACID, PLASMA  LACTIC ACID, PLASMA  URINE DRUG SCREEN, QUALITATIVE ([facility] ONLY)  ETHANOL  ACETAMINOPHEN LEVEL  SALICYLATE LEVEL   ____________________________________________  EKG  ED ECG REPORT I, Arelia Longest, the attending physician, personally viewed and interpreted this ECG.   Date: 11/01/2015  EKG Time: 1154  Rate: 123  Rhythm: sinus tachycardia  Axis: Normal axis  Intervals:Borderline prolonged PR.  ST&T Change: No ST segment elevation or depression. No abnormal T-wave inversion.  ____________________________________________  RADIOLOGY      CT Head Wo Contrast (Final result) Result time: 11/01/15 13:23:32   Final result by Rad Results In Interface (11/01/15 13:23:32)   Narrative:   CLINICAL DATA: Found unresponsive. Complains of lower leg pain.  EXAM: CT HEAD WITHOUT CONTRAST  CT CERVICAL SPINE WITHOUT CONTRAST  TECHNIQUE: Multidetector CT imaging of the head and cervical spine was performed following the standard protocol without intravenous contrast. Multiplanar CT image reconstructions of the cervical spine were also generated.  COMPARISON:  None.  FINDINGS: CT HEAD FINDINGS  No acute cortical infarct, hemorrhage, or mass lesion ispresent. Ventricles are of normal size. No significant extra-axial fluid collection is present. The paranasal sinuses andmastoid air cells are clear. The osseous skull is intact. Mild mucosal thickening involving the left sphenoid sinus noted. There is a chronic fracture deformity involving the medial wall of the right orbit.  CT CERVICAL SPINE FINDINGS  Normal alignment of the cervical spine. The vertebral body heights are well preserved. The facet joints are well-aligned. The prevertebral soft tissue space is  normal. No acute fracture or subluxation identified. There is no fracture or subluxation identified.  IMPRESSION: 1. No acute intracranial abnormality. 2. No evidence for cervical spine fracture or subluxation.   Electronically Signed By: Signa Kell M.D. On: 11/01/2015 13:23          CT Cervical Spine Wo Contrast (Final result) Result time: 11/01/15 13:23:32   Final result by Rad Results In Interface (11/01/15 13:23:32)   Narrative:   CLINICAL DATA: Found unresponsive. Complains of lower leg pain.  EXAM: CT HEAD WITHOUT CONTRAST  CT CERVICAL SPINE WITHOUT CONTRAST  TECHNIQUE: Multidetector CT imaging of the head and cervical spine was performed following the standard protocol without intravenous contrast. Multiplanar CT image reconstructions of the cervical spine were also generated.  COMPARISON: None.  FINDINGS: CT HEAD FINDINGS  No acute cortical infarct, hemorrhage, or mass lesion ispresent. Ventricles are of normal size. No significant extra-axial fluid collection is present. The paranasal sinuses andmastoid air cells are clear. The osseous skull is intact. Mild mucosal thickening involving the left sphenoid sinus noted. There is a chronic fracture deformity involving the medial wall of the right orbit.  CT CERVICAL SPINE FINDINGS  Normal  alignment of the cervical spine. The vertebral body heights are well preserved. The facet joints are well-aligned. The prevertebral soft tissue space is normal. No acute fracture or subluxation identified. There is no fracture or subluxation identified.  IMPRESSION: 1. No acute intracranial abnormality. 2. No evidence for cervical spine fracture or subluxation.   Electronically Signed By: Signa Kell M.D. On: 11/01/2015 13:23          CT Angio Chest PE W/Cm &/Or Wo Cm (Final result) Result time: 11/01/15 13:25:24   Final result by Rad Results In Interface (11/01/15 13:25:24)   Narrative:   CLINICAL DATA: Found unresponsive. Tachycardia.  EXAM: CT ANGIOGRAPHY CHEST WITH CONTRAST  TECHNIQUE: Multidetector CT imaging of the chest was performed using the standard protocol during bolus administration of intravenous contrast. Multiplanar CT image reconstructions and MIPs were obtained to evaluate the vascular anatomy.  CONTRAST: 75 cc Isovue 370 IV  COMPARISON: 11/01/2015  FINDINGS: Mediastinum/Nodes: Heart is borderline enlarged. Aorta is normal caliber. No dissection. Small scattered mediastinal lymph nodes. No mediastinal, hilar, or axillary adenopathy.  There are filling defects within the left lower lobe pulmonary artery extending into segmental branches posteriorly compatible with pulmonary emboli. Probable small pulmonary emboli in the upper lobe of the right lung. No evidence of right heart strain.  Lungs/Pleura: Endotracheal tube in place with the tip just above the carina. Areas of atelectasis in the lower lobes bilaterally. Probable atelectasis posteriorly and medially in the right upper lobe. Mild ground-glass opacities and interlobular septal thickening may reflect interstitial edema. No visible effusions.  Upper abdomen: Imaging into the upper abdomen shows no acute findings. NG tube in the stomach.  Musculoskeletal: Chest wall soft  tissues are unremarkable. No acute bony abnormality.  Review of the MIP images confirms the above findings.  IMPRESSION: Pulmonary emboli involving the left lower lobe and likely small pulmonary emboli in the right upper lobe.  Borderline cardiomegaly. Areas of ground-glass opacities and interlobular septal thickening could reflect early interstitial edema.  Airspace opacities in the lower lobes likely reflect atelectasis. Airspace opacity in the right upper lobe could reflect atelectasis or infiltrate.  Endotracheal tube tip just above the carina.  Critical Value/emergent results were called by telephone at the time of interpretation on 11/01/2015 at 1:23 pm to Dr. Gladstone Pih , who verbally acknowledged  these results.   Electronically Signed By: Charlett Nose M.D. On: 11/01/2015 13:25          DG Pelvis 1-2 Views (Final result) Result time: 11/01/15 12:48:53   Final result by Rad Results In Interface (11/01/15 12:48:53)   Narrative:   CLINICAL DATA: Status post intubation. Unresponsive.  EXAM: PELVIS - 1-2 VIEW  COMPARISON: Abdominal radiographs 10/04/2015 and 06/17/2013  FINDINGS: No fracture is identified. Femoral heads are approximated with the acetabula on this single AP projection, with mild bilateral hip joint spurring noted. A monitoring lead overlies the bladder.  IMPRESSION: No acute osseous abnormality identified.   Electronically Signed By: Sebastian Ache M.D. On: 11/01/2015 12:48          DG Chest Portable 1 View (Final result) Result time: 11/01/15 12:42:39   Final result by Rad Results In Interface (11/01/15 12:42:39)   Narrative:   CLINICAL DATA: Post intubation.  EXAM: PORTABLE CHEST 1 VIEW  COMPARISON: 10/04/2015  FINDINGS: Interval removal of right central line. NG tube and endotracheal tube are in place, unchanged. Improving aeration in the lungs. Minimal residual left base atelectasis. No confluent  opacity on the right. Heart is normal size.  IMPRESSION: Improving aeration in the lungs. Minimal residual left base atelectasis.   Electronically Signed By: Charlett Nose M.D. On: 11/01/2015 12:42    ____________________________________________   PROCEDURES  Angiocath insertion Performed by: Arelia Longest  Consent: Verbal consent obtained. Risks and benefits: risks, benefits and alternatives were discussed Time out: Immediately prior to procedure a "time out" was called to verify the correct patient, procedure, equipment, support staff and site/side marked as required.  Preparation: Patient was prepped and draped in the usual sterile fashion.  Vein Location: right basilic  Ultrasound Guided  Gauge: 18  Normal blood return and flush without difficulty Patient tolerance: Patient tolerated the procedure well with no immediate complications.  INTUBATION Performed by: Arelia Longest  Required items: required blood products, implants, devices, and special equipment available Patient identity confirmed: provided demographic data and hospital-assigned identification number Time out: Immediately prior to procedure a "time out" was called to verify the correct patient, procedure, equipment, support staff and site/side marked as required.  Indications: Airway protection   Intubation method: Glidescope Laryngoscopy   Preoxygenation: BVM  Sedatives: Patient already sedated and unresponsive Paralytic: Roccuronium  Tube Size: 7 cuffed  Post-procedure assessment: chest rise and ETCO2 monitor Breath sounds: equal and absent over the epigastrium Tube secured with: ETT holder Chest x-ray interpreted by radiologist and me.  Chest x-ray findings: endotracheal tube in appropriate position  Patient tolerated the procedure well with no immediate complications.  CRITICAL CARE Performed by: Arelia Longest   Total critical care time:  Critical  care time was exclusive of separately billable procedures and treating other patients.  Critical care was necessary to treat or prevent imminent or life-threatening deterioration.  Critical care was time spent personally by me on the following activities: development of treatment plan with patient and/or surrogate as well as nursing, discussions with consultants, evaluation of patient's response to treatment, examination of patient, obtaining history from patient or surrogate, ordering and performing treatments and interventions, ordering and review of laboratory studies, ordering and review of radiographic studies, pulse oximetry and re-evaluation of patient's condition.       ____________________________________________   INITIAL IMPRESSION / ASSESSMENT AND PLAN / ED COURSE  Pertinent labs & imaging results that were available during my care of the patient were reviewed  by me and considered in my medical decision making (see chart for details).  ----------------------------------------- 12:47 PM on 11/01/2015 -----------------------------------------  Discussed the case with ICU doctor, Dr. Nicholos Johns.  He is in the ER at this time and is evaluating the patient.  ----------------------------------------- 1:52 PM on 11/01/2015 ----------------------------------------- Initial management of the patient with multiple rounds of Narcan did improve the patient's respiratory drive and not her mental status. The decision was made to intubate her for airway protection. Patient found to have multiple pulmonary emboli. No signs of right heart strain on the CT angiography. I discussed the case with Dr. Isaiah Serge of pulmonology a [facility] and he does not think that she is a candidate for TPA given her CAT scan findings as well as hemodynamic stability as well as a undetectable troponin. She did have several blood pressures in the 80s but this is after propofol administration. The dosing of  propofol was lowered and the pressure is resolving now to the 90s. I also updated the patient's husband, Mr. Benham, and he is aware of the blood clots as well as starting heparin. The patient will be kept here at [facility] because she does not qualify for TPA at this time. Dr. Nicholos Johns looks of the patient to the ICU. ____________________________________________   FINAL CLINICAL IMPRESSION(S) / ED DIAGNOSES  Multiple pulmonary emboli. Altered mental status.    NEW MEDICATIONS STARTED DURING THIS VISIT:  New Prescriptions   No medications on file     Note:  This document was prepared using Dragon voice recognition software and may include unintentional dictation errors.    Myrna Blazer, MD 11/01/15 1357  Lactic acid elevation likely from patient's pulmonary emboli. Unlikely to be from sepsis. Patient is afebrile. No elevation in the white blood cell count. Multiple pulmonary emboli on CAT scan.  Myrna Blazer, MD 11/01/15 [phone]

## 2015-11-01 NOTE — ED Notes (Signed)
Patient taken to CT by RN and Respiratory

## 2015-11-01 NOTE — Progress Notes (Signed)
Brief Nutrition Note  Consult received for enteral/tube feeding initiation and management.  Adult Enteral Nutrition Protocol initiated. Full assessment to follow.  Admitting Dx: Pulmonary emboli (HCC) [I26.99] Altered mental status, unspecified altered mental status type [R41.82] Other acute pulmonary embolism without acute cor pulmonale (HCC) [I26.99]  Body mass index is 47.29 kg/(m^2). Pt meets criteria for morbid obesity based on current BMI.  Labs:   Recent Labs Lab 11/01/15 1202  NA 137  K 3.6  CL 107  CO2 21*  BUN 12  CREATININE 1.07*  CALCIUM 8.3*  GLUCOSE 208*    Courtney Hebert, [place]RD, LDN Pager [phone] Weekend/On-Call Pager [phone]

## 2015-11-01 NOTE — Progress Notes (Signed)
Heparin level obtained at 1739 drawn early (~2.5 hours after drip started instead of 6 hours). HL of 0.51 is within therapeutic range - will continue with current rate of 1500 units/hr and reorder heparin level for 2115 which will represent a 6 hour level.  Jodelle RedMary M Skiler Olden 7:07 PM

## 2015-11-01 NOTE — Progress Notes (Signed)
ANTICOAGULATION CONSULT NOTE - Initial Consult  Pharmacy Consult for Heparin Infusion  Indication: pulmonary embolus  Allergies  Allergen Reactions  . Vancomycin Anaphylaxis    Patient Measurements: Weight: (!) 302 lb 0.5 oz (137 kg) Heparin Dosing Weight: 95 kg  Vital Signs: Temp: 96.7 F (35.9 C) (06/03 1345) BP: 94/54 mmHg (06/03 1345) Pulse Rate: 72 (06/03 1345)  Labs:  Recent Labs  11/01/15 1202  HGB 15.3  HCT 47.1*  PLT 328  LABPROT 14.0  INR 1.06  CREATININE 1.07*  TROPONINI <0.03    Estimated Creatinine Clearance: 100.3 mL/min (by C-G formula based on Cr of 1.07).   Medical History: Past Medical History  Diagnosis Date  . Chronic back pain   . Vascular disease   . DVT (deep venous thrombosis) (HCC)   . Bipolar 1 disorder [facility](HCC)     Assessment: 40 yo patient with PMH of polysubstance abuse and overdose.  CT of chest showing LLL filling defect constant with PE. Pharmacy consulted for dosing and monitoring of heparin.   Patient was prescribed warfarin outpatient but PT was 14 and INR 1.06 on admission.    Goal of Therapy:  Heparin level 0.3-0.7 units/ml Monitor platelets by anticoagulation protocol: Yes   Plan:  Give 4000 units bolus x 1 Start heparin infusion at 1500 units/hr Check anti-Xa level in 6 hours and daily while on heparin Continue to monitor H&H and platelets  Cher NakaiSheema Shailey Butterbaugh, PharmD Pharmacy Resident  11/01/2015,2:01 PM

## 2015-11-01 NOTE — ED Notes (Signed)
Patient arrived via EMS with the following medications from home  Lithium Doxe-pin Hydrochlorothiazide Zofran OTC Naproxen

## 2015-11-01 NOTE — Progress Notes (Signed)
Called to eval pt in ED due to vent dependent found down. Daughter and son-in-law at bedside able to provide additional history. Apparently son-in-law was in next room after speaking with her,  and heard her fall from her bed on to her night stand. He discovered her passed out and blue.Marland Kitchen. She was apneic, but then started snoring respirations.   D/w ED, sent for CT chest and head. On review of CT chest there is a large LLL filling defect which appears c/w PE. No RV enlargement noted, however given syncopal episode [facility] re: catheter directed thrombolytics.   If not candidate will [facility] ICU.   Wells Guiles-Deep Xzaiver Vayda, M.D. 11/01/2015

## 2015-11-01 NOTE — ED Notes (Signed)
Patient family at bedside, per patient family, patient was alert and talking, patient stated that she felt like someone was stabbing her in the legs. Patient family member walked out of the room and a few minutes later heard a noise that sounded like something hitting the floor, family member states that they went to check on patient and found that she has fallen over, landing with her neck on the drawer to her night stand. Family reports that patient's face, arms, and legs were blue and that he could not find a pulse, family member reports "slapping the shit out of her and yelling at her."

## 2015-11-01 NOTE — ED Notes (Signed)
[facility], patient found unresponsive with unknown down time. Patient was given 2 mg of Narcan intranasal by EMS. Patient arrived to the ER being assisted with ventilations by EMS

## 2015-11-01 NOTE — Progress Notes (Signed)
Called [facility] ED to check if Pt is candidate for lytics.  I reviewed the case, images with the the IR radiologist Dr. Miles CostainShick. Due to low clot burden and no evidence of RV strain she is not a candidate for EKOS at present. Recommend heparin anticoagulation, echo and observation. If she should deteriorate then we can reconsider.   Courtney GreathousePraveen Meshach Perry MD [facility] Pager [phone] If no answer or after 3pm call: [phone] 11/01/2015, 2:12 PM

## 2015-11-01 NOTE — ED Notes (Signed)
Patient back from CT.

## 2015-11-02 ENCOUNTER — Inpatient Hospital Stay: Payer: Self-pay

## 2015-11-02 DIAGNOSIS — T40601A Poisoning by unspecified narcotics, accidental (unintentional), initial encounter: Secondary | ICD-10-CM

## 2015-11-02 DIAGNOSIS — F1320 Sedative, hypnotic or anxiolytic dependence, uncomplicated: Secondary | ICD-10-CM

## 2015-11-02 DIAGNOSIS — R45851 Suicidal ideations: Secondary | ICD-10-CM

## 2015-11-02 DIAGNOSIS — F3132 Bipolar disorder, current episode depressed, moderate: Secondary | ICD-10-CM

## 2015-11-02 DIAGNOSIS — Z046 Encounter for general psychiatric examination, requested by authority: Secondary | ICD-10-CM

## 2015-11-02 LAB — BASIC METABOLIC PANEL
Anion gap: 9 (ref 5–15)
BUN: 17 mg/dL (ref 6–20)
CHLORIDE: 109 mmol/L (ref 101–111)
CO2: 21 mmol/L — AB (ref 22–32)
Calcium: 8.3 mg/dL — ABNORMAL LOW (ref 8.9–10.3)
Creatinine, Ser: 1.01 mg/dL — ABNORMAL HIGH (ref 0.44–1.00)
GFR calc non Af Amer: >60 mL/min (ref >60)
Glucose, Bld: 94 mg/dL (ref 65–99)
POTASSIUM: 4.5 mmol/L (ref 3.5–5.1)
SODIUM: 139 mmol/L (ref 135–145)

## 2015-11-02 LAB — GLUCOSE, CAPILLARY
GLUCOSE-CAPILLARY: 83 mg/dL (ref 65–99)
Glucose-Capillary: 97 mg/dL (ref 65–99)
Glucose-Capillary: 95 mg/dL (ref 65–99)
Glucose-Capillary: 80 mg/dL (ref 65–99)

## 2015-11-02 LAB — BLOOD GAS, ARTERIAL
ACID-BASE DEFICIT: 0.8 mmol/L (ref 0.0–2.0)
Bicarbonate: 24.9 mEq/L (ref 21.0–28.0)
FIO2: 0.4
Mechanical Rate: 12
O2 SAT: 99.2 %
PEEP: 5 cmH2O
PH ART: 7.36 (ref 7.350–7.450)
Patient temperature: 37
VT: 500 mL
pCO2 arterial: 44 mmHg (ref 32.0–48.0)
pO2, Arterial: 148 mmHg — ABNORMAL HIGH (ref 83.0–108.0)

## 2015-11-02 LAB — BLOOD CULTURE ID PANEL (REFLEXED)
Acinetobacter baumannii: NOT DETECTED
CANDIDA GLABRATA: NOT DETECTED
CANDIDA KRUSEI: NOT DETECTED
CANDIDA PARAPSILOSIS: NOT DETECTED
CANDIDA TROPICALIS: NOT DETECTED
Candida albicans: NOT DETECTED
Carbapenem resistance: NOT DETECTED
ESCHERICHIA COLI: NOT DETECTED
Enterobacter cloacae complex: NOT DETECTED
Enterobacteriaceae species: NOT DETECTED
Enterococcus species: NOT DETECTED
Haemophilus influenzae: NOT DETECTED
KLEBSIELLA OXYTOCA: NOT DETECTED
KLEBSIELLA PNEUMONIAE: NOT DETECTED
LISTERIA MONOCYTOGENES: NOT DETECTED
Methicillin resistance: DETECTED — AB
Neisseria meningitidis: NOT DETECTED
PROTEUS SPECIES: NOT DETECTED
Pseudomonas aeruginosa: NOT DETECTED
SERRATIA MARCESCENS: NOT DETECTED
STREPTOCOCCUS SPECIES: NOT DETECTED
Staphylococcus aureus (BCID): NOT DETECTED
Staphylococcus species: DETECTED — AB
Streptococcus agalactiae: NOT DETECTED
Streptococcus pneumoniae: NOT DETECTED
Streptococcus pyogenes: NOT DETECTED
Vancomycin resistance: NOT DETECTED

## 2015-11-02 LAB — CBC
HCT: 42.4 % (ref 35.0–47.0)
HEMATOCRIT: 43.3 % (ref 35.0–47.0)
Hemoglobin: 14 g/dL (ref 12.0–16.0)
Hemoglobin: 13.8 g/dL (ref 12.0–16.0)
MCH: 28.3 pg (ref 26.0–34.0)
MCH: 28.8 pg (ref 26.0–34.0)
MCHC: 32.4 g/dL (ref 32.0–36.0)
MCHC: 32.7 g/dL (ref 32.0–36.0)
MCV: 87.3 fL (ref 80.0–100.0)
MCV: 88.1 fL (ref 80.0–100.0)
PLATELETS: 231 10*3/uL (ref 150–440)
PLATELETS: 250 10*3/uL (ref 150–440)
RBC: 4.95 MIL/uL (ref 3.80–5.20)
RBC: 4.81 MIL/uL (ref 3.80–5.20)
RDW: 16.9 % — AB (ref 11.5–14.5)
RDW: 17 % — AB (ref 11.5–14.5)
WBC: 11.4 10*3/uL — AB (ref 3.6–11.0)
WBC: 10.1 10*3/uL (ref 3.6–11.0)

## 2015-11-02 LAB — HEPARIN LEVEL (UNFRACTIONATED): Heparin Unfractionated: 0.38 IU/mL (ref 0.30–0.70)

## 2015-11-02 LAB — PHOSPHORUS
PHOSPHORUS: 3.7 mg/dL (ref 2.5–4.6)
Phosphorus: 3.6 mg/dL (ref 2.5–4.6)

## 2015-11-02 LAB — MAGNESIUM
MAGNESIUM: 2.1 mg/dL (ref 1.7–2.4)
Magnesium: 1.9 mg/dL (ref 1.7–2.4)

## 2015-11-02 MED ORDER — CHLORHEXIDINE GLUCONATE 0.12% ORAL RINSE (MEDLINE KIT)
15.0000 mL | Freq: Two times a day (BID) | OROMUCOSAL | Status: DC
  Administered 2015-11-02: 15 mL via OROMUCOSAL
  Filled 2015-11-02 (×5): qty 15

## 2015-11-02 MED ORDER — NICOTINE 21 MG/24HR TD PT24
21.0000 mg | MEDICATED_PATCH | Freq: Every day | TRANSDERMAL | Status: DC
  Administered 2015-11-02 – 2015-11-03 (×2): 21 mg via TRANSDERMAL
  Filled 2015-11-02 (×3): qty 1

## 2015-11-02 MED ORDER — RIVAROXABAN 10 MG PO TABS: 20.0000 mg | ORAL_TABLET | Freq: Every day | ORAL | Status: DC

## 2015-11-02 MED ORDER — METHADONE HCL 10 MG PO TABS
20.0000 mg | ORAL_TABLET | Freq: Once | ORAL | Status: AC
  Administered 2015-11-02: 20 mg via ORAL
  Filled 2015-11-02: qty 2

## 2015-11-02 MED ORDER — ANTISEPTIC ORAL RINSE SOLUTION (CORINZ)
7.0000 mL | OROMUCOSAL | Status: DC
  Administered 2015-11-02 (×4): 7 mL via OROMUCOSAL
  Filled 2015-11-02 (×26): qty 7

## 2015-11-02 MED ORDER — CHLORDIAZEPOXIDE HCL 10 MG PO CAPS
10.0000 mg | ORAL_CAPSULE | ORAL | Status: AC
  Administered 2015-11-02: 10 mg via ORAL
  Filled 2015-11-02: qty 1

## 2015-11-02 MED ORDER — METHADONE HCL 10 MG PO TABS
40.0000 mg | ORAL_TABLET | ORAL | Status: AC
  Administered 2015-11-02: 40 mg via ORAL
  Filled 2015-11-02: qty 4

## 2015-11-02 MED ORDER — RIVAROXABAN 15 MG PO TABS
15.0000 mg | ORAL_TABLET | Freq: Two times a day (BID) | ORAL | Status: DC
  Administered 2015-11-02 – 2015-11-03 (×3): 15 mg via ORAL
  Filled 2015-11-02 (×3): qty 1

## 2015-11-02 MED ORDER — CHLORDIAZEPOXIDE HCL 10 MG PO CAPS
10.0000 mg | ORAL_CAPSULE | Freq: Two times a day (BID) | ORAL | Status: AC
  Administered 2015-11-02 – 2015-11-03 (×3): 10 mg via ORAL
  Filled 2015-11-02 (×3): qty 1

## 2015-11-02 MED ORDER — SENNA 8.6 MG PO TABS: 2.0000 | ORAL_TABLET | Freq: Every day | ORAL | Status: DC | PRN

## 2015-11-02 MED ORDER — HEPARIN (PORCINE) IN NACL 100-0.45 UNIT/ML-% IJ SOLN
1500.0000 [IU]/h | INTRAMUSCULAR | Status: AC
  Administered 2015-11-02: 1500 [IU]/h via INTRAVENOUS
  Filled 2015-11-02 (×2): qty 250

## 2015-11-02 MED ORDER — POLYETHYLENE GLYCOL 3350 17 G PO PACK
17.0000 g | PACK | Freq: Every day | ORAL | Status: DC
  Administered 2015-11-03: 17 g via ORAL
  Filled 2015-11-02: qty 1

## 2015-11-02 MED ORDER — SULFAMETHOXAZOLE-TRIMETHOPRIM 800-160 MG PO TABS
2.0000 | ORAL_TABLET | Freq: Two times a day (BID) | ORAL | Status: DC
  Administered 2015-11-02 – 2015-11-03 (×3): 2 via ORAL
  Filled 2015-11-02 (×4): qty 2

## 2015-11-02 MED ORDER — ENSURE ENLIVE PO LIQD
237.0000 mL | Freq: Three times a day (TID) | ORAL | Status: AC
  Administered 2015-11-03 (×2): 237 mL via ORAL

## 2015-11-02 MED ORDER — METHADONE HCL 10 MG PO TABS
20.0000 mg | ORAL_TABLET | Freq: Two times a day (BID) | ORAL | Status: DC
  Administered 2015-11-03 (×2): 20 mg via ORAL
  Filled 2015-11-02 (×2): qty 2

## 2015-11-02 NOTE — Progress Notes (Signed)
Pharmacy Antibiotic Note  Courtney Hebert is a 40 y.o. female admitted on 11/01/2015 after being found unresponsive. Patient has PMH  Of bipolar disorder and IV drug use. Was found to have large PE on admission.  Pharmacy was contacted by lab with Blood culture results @ 0945 on 11/02/15. 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 Patient's reported allergy is anaphylaxis reaction when vancomycin.  After  Discussion with Dr. Nicholos Johnsamachandran- he feels these results are due to contamination and patient just being colonized. He reports that patient is showing no clinical signs of symptoms of infection.    Pharmacy has been consulted for bactrim PO dosing until cultures are finalized.  Plan: Will start patient on SMX/TMP DS 2 tablets every 12 hours.   Will continue to follow microbiology updates.  Height: 5\' 7"  (170.2 cm) Weight: (!) 302 lb 14.6 oz (137.4 kg) IBW/kg (Calculated) : 61.6  Temp (24hrs), Avg:97.3 F (36.3 C), Min:96.6 F (35.9 C), Max:99.9 F (37.7 C)   Recent Labs Lab 11/01/15 1202 11/01/15 1651 11/01/15 1739 11/02/15 0507 11/02/15 0824  WBC 9.0  --  12.9* 11.4* 10.1  CREATININE 1.07*  --  0.82 1.01*  --   LATICACIDVEN 2.4* 1.1  --   --   --     Estimated Creatinine Clearance: 106.3 mL/min (by C-G formula based on Cr of 1.01).    Allergies  Allergen Reactions  . Vancomycin Anaphylaxis    Thank you for allowing pharmacy to be a part of this patient's care.  Cher NakaiSheema Kelie Gainey, PharmD Pharmacy Resident  11/02/2015 10:33 AM

## 2015-11-02 NOTE — Progress Notes (Signed)
Pt. Extubated to room air,sat 98%. No resp. Distress noted at this time.

## 2015-11-02 NOTE — Progress Notes (Addendum)
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 Patient has a vancomycin allergy. Currently, the patient shows no signs of sepsis/infection, and is afebrile. 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 She may be discharged in the next few days, discussed with the pharmacy regarding something that she could be discharged on a can be continued orally, therefore we'll start the patient on Bactrim.  40 yo female with history of IVDA, DVT, bipolar disorder and chronic back pain presents with syncopal episode due to PE. Recent suicidal thoughts per husband.   PULMONARY A: Acute pulmonary emboli. Syncopal episode secondary to above. Acute respiratory failure. History of DVT, was supposed to be on coumadin at home, but INR was normal at time of admission.  P:   -Patient has been started on anticoagulation. Currently on heparin, pt currently angry and refusing to take oral meds, therefore will continue heparin, and transition to xarelto or similar when she has calmed down.  -Case was reviewed with interventional radiology, she was not felt to be a candidate for catheter directed thrombolytics, due to lack of significantly large clot burden and lactose evidence of RV strain. -We ordered echocardiogram-currently the tech declined to come in over the weekend. Apparently planned to come in on Monday.  -The patient's home medication list includes warfarin, however, her INR was normal at the time of admission. --ABG reviewed, compensated. Vent on PRVC/12/500/5/40%. --Will perform weaning trial today and extubate if tolerated.   CARDIOVASCULAR A: Syncopal episode. P:  Will monitor blood pressure, start IV fluids as needed.   RENAL A:  Acute kidney injury. P:   -Continue IV fluids. -Trend creatinine -Monitor and replace electrolytes  GASTROINTESTINAL A:   GI prophylaxis. Start tube feeds.  HEMATOLOGIC A:  Monitor CBC. Check lower extremity Dopplers. P:   INFECTIOUS A: -  Micro/culture results:  BCx2 -- UC -- Sputum--  Antibiotics:   ENDOCRINE A:   No acute issues P:   -Monitor blood glucose with BMP  NEUROLOGIC A:  Acute encephalopathy-hypoxic versus metabolic-continue to monitor.  P:  -Monitor mental status -Continue propofol for vent sedation and comfort   MAJOR EVENTS/TEST RESULTS: 11/01/2015: Presented to the ED following syncopal episode; admitted with PE  Best Practices  DVT Prophylaxis: on heparin.  GI Prophylaxis: protonix.    ---------------------------------------  ---------------------------------------   Name: Courtney Hebert MRN: [id number] DOB: 1976-05-15    ADMISSION DATE:  11/01/2015  Subjective: No acute issues overnight. Remains on full vent support and sedation. Both eyes to voice and touch but unable to follow  VITAL SIGNS: Temp:  [96.6 F (35.9 C)-99.5 F (37.5 C)] 99.3 F (37.4 C) (06/04 0600) Pulse Rate:  [52-123] 66 (06/04 0600) Resp:  [0-34] 14 (06/04 0600) BP: (80-213)/(37-130) 99/66 mmHg (06/04 0600) SpO2:  [88 %-100 %] 100 % (06/04 0600) FiO2 (%):  [40 %-50 %] 40 % (06/04 0425) Weight:  [299 lb 13.2 oz (136 kg)-302 lb 14.6 oz (137.4 kg)] 302 lb 14.6 oz (137.4 kg) (06/04 0351) HEMODYNAMICS:   VENTILATOR SETTINGS: Vent Mode:  [-] PRVC FiO2 (%):  [40 %-50 %] 40 % Set Rate:  [12 bmp-20 bmp] 12 bmp Vt Set:  [500 mL] 500 mL PEEP:  [5 cmH20] 5 cmH20 Plateau Pressure:  [18 cmH20] 18 cmH20 INTAKE / OUTPUT:  Intake/Output Summary (Last 24 hours) at 11/02/15 4098 Last data filed at  11/02/15 0616  Gross per 24 hour  Intake 573.61 ml  Output    700 ml  Net -126.39 ml    Physical Examination:   VS: BP 99/66 mmHg  Pulse 66  Temp(Src) 99.3 F (37.4 C) (Core (Comment))  Resp 14  Ht 5\' 7"  (1.702 m)  Wt 302 lb 14.6 oz (137.4 kg)  BMI 47.43 kg/m2  SpO2 100%  LMP  (LMP  Unknown)  General Appearance: No distress  Neuro:without focal findings, mental status reduced.  HEENT: PERRLA, sluggish no ptosis, no other lesions noticed;  Pulmonary: normal breath sounds., diaphragmatic excursion normal. CardiovascularNormal S1,S2.  No m/r/g.    Abdomen: Benign, Soft, non-tender, No masses, hepatosplenomegaly, No lymphadenopathy Renal:  No costovertebral tenderness  GU:  Not performed at this time. Endoc: No evident thyromegaly, no signs of acromegaly. Skin:   warm, no rashes, no ecchymosis , multiple circular rashes/scars throughout body Extremities: normal, no cyanosis, clubbing, no edema, warm with normal capillary refill.   LABS: Reviewed  LABORATORY PANEL:   CBC  Recent Labs Lab 11/02/15 0507  WBC 11.4*  HGB 14.0  HCT 43.3  PLT 231    Chemistries   Recent Labs Lab 11/01/15 1202  11/02/15 0507  NA 137  < > 139  K 3.6  < > 4.5  CL 107  < > 109  CO2 21*  < > 21*  GLUCOSE 208*  < > 94  BUN 12  < > 17  CREATININE 1.07*  < > 1.01*  CALCIUM 8.3*  < > 8.3*  MG  --   < > 2.1  PHOS  --   < > 3.7  AST 31  --   --   ALT 24  --   --   ALKPHOS 76  --   --   BILITOT 0.2*  --   --   < > = values in this interval not displayed.   Recent Labs Lab 11/01/15 1610 11/01/15 1941 11/01/15 2324 11/02/15 0332  GLUCAP 76 80 86 83    Recent Labs Lab 11/01/15 1207 11/02/15 0500  PHART 7.24* 7.36  PCO2ART 51* 44  PO2ART 84 148*    Recent Labs Lab 11/01/15 1202  AST 31  ALT 24  ALKPHOS 76  BILITOT 0.2*  ALBUMIN 3.6    Cardiac Enzymes  Recent Labs Lab 11/01/15 1202  TROPONINI <0.03    RADIOLOGY:  Dg Pelvis 1-2 Views  11/01/2015  CLINICAL DATA:  Status post intubation.  Unresponsive. EXAM: PELVIS - 1-2 VIEW COMPARISON:  Abdominal radiographs 10/04/2015 and 06/17/2013 FINDINGS: No fracture is identified. Femoral heads are approximated with the acetabula on this single AP projection, with mild bilateral hip joint spurring noted. A  monitoring lead overlies the bladder. IMPRESSION: No acute osseous abnormality identified. Electronically Signed   By: Sebastian Ache M.D.   On: 11/01/2015 12:48   Ct Head Wo Contrast  11/01/2015  CLINICAL DATA:  Found unresponsive.  Complains of lower leg pain. EXAM: CT HEAD WITHOUT CONTRAST CT CERVICAL SPINE WITHOUT CONTRAST TECHNIQUE: Multidetector CT imaging of the head and cervical spine was performed following the standard protocol without intravenous contrast. Multiplanar CT image reconstructions of the cervical spine were also generated. COMPARISON:  None. FINDINGS: CT HEAD FINDINGS No acute cortical infarct, hemorrhage, or mass lesion ispresent. Ventricles are of normal size. No significant extra-axial fluid collection is present. The paranasal sinuses andmastoid air cells are clear. The osseous skull is intact. Mild mucosal thickening involving the left sphenoid sinus  noted. There is a chronic fracture deformity involving the medial wall of the right orbit. CT CERVICAL SPINE FINDINGS Normal alignment of the cervical spine. The vertebral body heights are well preserved. The facet joints are well-aligned. The prevertebral soft tissue space is normal. No acute fracture or subluxation identified. There is no fracture or subluxation identified. IMPRESSION: 1. No acute intracranial abnormality. 2. No evidence for cervical spine fracture or subluxation. Electronically Signed   By: Signa Kellaylor  Stroud M.D.   On: 11/01/2015 13:23   Ct Angio Chest Pe W/cm &/or Wo Cm  11/01/2015  CLINICAL DATA:  Found unresponsive.  Tachycardia. EXAM: CT ANGIOGRAPHY CHEST WITH CONTRAST TECHNIQUE: Multidetector CT imaging of the chest was performed using the standard protocol during bolus administration of intravenous contrast. Multiplanar CT image reconstructions and MIPs were obtained to evaluate the vascular anatomy. CONTRAST:  75 cc Isovue 370 IV COMPARISON:  11/01/2015 FINDINGS: Mediastinum/Nodes: Heart is borderline enlarged. Aorta  is normal caliber. No dissection. Small scattered mediastinal lymph nodes. No mediastinal, hilar, or axillary adenopathy. There are filling defects within the left lower lobe pulmonary artery extending into segmental branches posteriorly compatible with pulmonary emboli. Probable small pulmonary emboli in the upper lobe of the right lung. No evidence of right heart strain. Lungs/Pleura: Endotracheal tube in place with the tip just above the carina. Areas of atelectasis in the lower lobes bilaterally. Probable atelectasis posteriorly and medially in the right upper lobe. Mild ground-glass opacities and interlobular septal thickening may reflect interstitial edema. No visible effusions. Upper abdomen: Imaging into the upper abdomen shows no acute findings. NG tube in the stomach. Musculoskeletal: Chest wall soft tissues are unremarkable. No acute bony abnormality. Review of the MIP images confirms the above findings. IMPRESSION: Pulmonary emboli involving the left lower lobe and likely small pulmonary emboli in the right upper lobe. Borderline cardiomegaly. Areas of ground-glass opacities and interlobular septal thickening could reflect early interstitial edema. Airspace opacities in the lower lobes likely reflect atelectasis. Airspace opacity in the right upper lobe could reflect atelectasis or infiltrate. Endotracheal tube tip just above the carina. Critical Value/emergent results were called by telephone at the time of interpretation on 11/01/2015 at 1:23 pm to Dr. Gladstone PihAVID SCHAEVITZ , who verbally acknowledged these results. Electronically Signed   By: Charlett NoseKevin  Dover M.D.   On: 11/01/2015 13:25   Ct Cervical Spine Wo Contrast  11/01/2015  CLINICAL DATA:  Found unresponsive.  Complains of lower leg pain. EXAM: CT HEAD WITHOUT CONTRAST CT CERVICAL SPINE WITHOUT CONTRAST TECHNIQUE: Multidetector CT imaging of the head and cervical spine was performed following the standard protocol without intravenous contrast.  Multiplanar CT image reconstructions of the cervical spine were also generated. COMPARISON:  None. FINDINGS: CT HEAD FINDINGS No acute cortical infarct, hemorrhage, or mass lesion ispresent. Ventricles are of normal size. No significant extra-axial fluid collection is present. The paranasal sinuses andmastoid air cells are clear. The osseous skull is intact. Mild mucosal thickening involving the left sphenoid sinus noted. There is a chronic fracture deformity involving the medial wall of the right orbit. CT CERVICAL SPINE FINDINGS Normal alignment of the cervical spine. The vertebral body heights are well preserved. The facet joints are well-aligned. The prevertebral soft tissue space is normal. No acute fracture or subluxation identified. There is no fracture or subluxation identified. IMPRESSION: 1. No acute intracranial abnormality. 2. No evidence for cervical spine fracture or subluxation. Electronically Signed   By: Signa Kellaylor  Stroud M.D.   On: 11/01/2015 13:23   Dg Chest Portable  1 View  11/01/2015  CLINICAL DATA:  Post intubation. EXAM: PORTABLE CHEST 1 VIEW COMPARISON:  10/04/2015 FINDINGS: Interval removal of right central line. NG tube and endotracheal tube are in place, unchanged. Improving aeration in the lungs. Minimal residual left base atelectasis. No confluent opacity on the right. Heart is normal size. IMPRESSION: Improving aeration in the lungs. Minimal residual left base atelectasis. Electronically Signed   By: Charlett Nose M.D.   On: 11/01/2015 12:42     [facility] ANP-BC Pulmonary and Critical Care Medicine [facility] Pager [phone] or [phone]  11/02/2015, 6:44 AM  Patient seen and examined with NP, agree with assessment and plan. Currently, the patient awake, she is a motioning that she wants the tube out, she appears very belligerent about it as she is slamming her head on the bed rails and pointing at the tube. We'll check weaning trial today and extubate if  tolerated. Per the patient's husband via the ER doctor's report, the patient had been contemplating suicide, therefore, we will await psych eval. Wells Guiles M.D. 11/02/2015

## 2015-11-02 NOTE — Progress Notes (Signed)
Pt appears calmer at this time.  She is attempting to use telephone.  No longer yelling or cussing at staff.

## 2015-11-02 NOTE — Progress Notes (Signed)
Lab results:  Gram Positive Cocci in AnAerobic and aerobic bottles of blood cultures.  Also positive for Methicillin resistant gene.

## 2015-11-02 NOTE — Consult Note (Signed)
  Psychiatry: Brief note. Full note to follow. Patient seen. Chart reviewed. Case discussed with intensive care unit attending and nursing staff. 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 She is currently agitated complaining of pain anxiety. She is also stating that she plans to go engage in behavior if possible that would result in her dying. Patient cannot be taken off commitment and cannot be safely discharged. Furthermore she medically still needs to be adequately anticoagulated for her pulmonary embolus.  As part of the plan I am working out with the patient I would like to give her 3 days of methadone for detox from the narcotics she is once again abusing. I will make it clear that the end goal of this treatment is for her to be detoxed and off of narcotics completely. The methadone clinic will not treat her with methadone anymore and she says she has an allergy she has discovered to  buprenorphine. Therefore there is no option for continued outpatient narcotic treatment. Her only option as far as narcotics is detox from abusable drugs. This is the reason for the stat methadone order. As I said, full note to follow.

## 2015-11-02 NOTE — Progress Notes (Signed)
Dr. Lorinda Creedlaypacks from Psychiatry here to evaluate patient.

## 2015-11-02 NOTE — Progress Notes (Signed)
ANTICOAGULATION CONSULT NOTE - Initial Consult  Pharmacy Consult for Xarelto  Indication: pulmonary embolus  Allergies  Allergen Reactions  . Vancomycin Anaphylaxis    Patient Measurements: Height: 5\' 7"  (170.2 cm) Weight: (!) 302 lb 14.6 oz (137.4 kg) IBW/kg (Calculated) : 61.6  Vital Signs: Temp: 99.9 F (37.7 C) (06/04 0900) BP: 118/73 mmHg (06/04 0900) Pulse Rate: 100 (06/04 0900)  Labs:  Recent Labs  11/01/15 1202 11/01/15 1739 11/01/15 2151 11/02/15 0507 11/02/15 0824  HGB 15.3 14.8  --  14.0 13.8  HCT 47.1* 45.2  --  43.3 42.4  PLT 328 281  --  231 250  LABPROT 14.0  --   --   --   --   INR 1.06  --   --   --   --   HEPARINUNFRC  --  0.51 0.61  --  0.38  CREATININE 1.07* 0.82  --  1.01*  --   TROPONINI <0.03  --   --   --   --     Estimated Creatinine Clearance: 106.3 mL/min (by C-G formula based on Cr of 1.01).   Medical History: Past Medical History  Diagnosis Date  . Chronic back pain   . Vascular disease   . DVT (deep venous thrombosis) (HCC)   . Bipolar 1 disorder [facility](HCC)    Assessment: 40 yo patient with PMH of polysubstance abuse and overdose. CT of chest showing LLL filling defect constant with PE  Pharmacy consulted for xarelto dosing and transition from heparin.   Patient was previously prescribed warfarin outpatient but PT was 14 and INR 1.06 on admission.     Plan:  Will start the the patient on Xarelto 15mg  BID x21 days, followed by Xarelto 20mg  daily.   Heparin drip to be discontinued at 1700 today when 1st Xarelto dose is due.   Patients nurse has been contacted with instructions to discontinue drip at the time of Xarelto administration.   Pharmacy will continue to follow per consult.   Cher NakaiSheema Natina Wiginton, PharmD Pharmacy Resident 11/02/2015,3:09 PM

## 2015-11-02 NOTE — Consult Note (Signed)
[facility] Face-to-Face Psychiatry Consult   Reason for Consult:  Consult for this 40 year old woman in the hospital on involuntary commitment also in the ICU. Brought in unconscious with what was initially presumed to be opiate overdose. Subsequently it was discovered that she had a pulmonary embolism. Referring Physician:  Bridgett Larsson Patient Identification: Courtney Hebert MRN:  [id number] Principal Diagnosis: Bipolar affective disorder, currently depressed, moderate ([place]) Diagnosis:   Patient Active Problem List   Diagnosis Date Noted  . Opiate overdose [T40.601A] 11/02/2015  . Suicidal ideation [R45.851] 11/02/2015  . Involuntary commitment [Z04.6] 11/02/2015  . Benzodiazepine abuse [F13.10] 11/02/2015  . Pulmonary embolism ([place]) [I26.99] 11/01/2015  . Bipolar affective disorder, currently depressed, moderate ([place]) [F31.32]   . Bipolar disorder ([place]) [F31.9] 10/06/2015  . Opiate dependence ([place]) [F11.20] 10/06/2015  . Methadone overdose [T40.3X4A] 10/03/2015  . Polysubstance abuse [F19.10] 10/03/2015  . Acute hypoxemic respiratory failure ([place]) [J96.01] 10/03/2015  . Tobacco abuse [Z72.0] 10/03/2015  . Migraine [G43.909] 10/03/2015    Total Time spent with patient: 1 hour  Subjective:   Courtney Hebert is a 40 y.o. female patient admitted with "I'm leaving here and I don't care if I die".  HPI:  Patient interviewed. Chart reviewed. Case discussed with CCU physician and nursing staff. This is a 41 year old woman known to me from previous encounters. Brought into the hospital this time after once again being found unconscious by her family. Initial presumption was opiate overdose based on previous encounters however during her treatment it was discovered that she also had a pulmonary embolism. Since waking up and being extubated the patient has been agitated and is trying to leave the hospital even though commitment papers were filed. On interview the patient states that she wants to leave the hospital  immediately. Her reason for wanting to leave is the severe pain in her legs. Patient states that she does not care whether she dies. She makes comments about putting bullets in her head to treat her pain. She tells me that if she leaves here she will "take care of myself" by going home and taking all the drugs she can whether it kills her or not. She is quite agitated about all of this and doesn't really give a very clear history. She is able to tell me that after her last discharge she went back to the methadone clinic and was started back on Suboxone rather than methadone. She believes that she had an allergy to Suboxone and that it was making her throw up for 2 weeks. She says at the end she could not stand it anymore and so she relapsed into drug use. She has once again been shooting up synthetic narcotics and has been taking illegal Xanax.  Social history: Patient lives with her husband. Husband is aware of her substance abuse problem and her medical problems. Patient is not currently working outside the home.  Medical history: Overweight. History of previous DVTs. History of chronic back pain. Bipolar disorder chronic migraines  Substance abuse history: Long history of abuse of multiple substances with narcotics being the main culprit. History of intravenous use of heroin. More recently had relapsed into intravenous use of synthetic narcotics that she buys online. Also abuses benzodiazepines.  Past Psychiatric History: Patient has a history of bipolar disorder. Had been on lithium in the past with good results and I tried restarting her on lithium last time she was in the hospital. Does have a history of hospitalizations also a prior history of suicide attempts.  Risk to Self: Is patient at risk for suicide?: No Risk to Others:   Prior Inpatient Therapy:   Prior Outpatient Therapy:    Past Medical History:  Past Medical History  Diagnosis Date  . Chronic back pain   . Vascular disease   .  DVT (deep venous thrombosis) ([place])   . Bipolar 1 disorder ([place])    History reviewed. No pertinent past surgical history. Family History: History reviewed. No pertinent family history. Family Psychiatric  History: Family history positive for mood instability Social History:  History  Alcohol Use  . Yes     History  Drug Use  . Yes  . Special: IV    Comment: heroin    Social History   Social History  . Marital Status: Married    Spouse Name: N/A  . Number of Children: N/A  . Years of Education: N/A   Social History Main Topics  . Smoking status: Current Every Day Smoker -- 1.00 packs/day    Types: Cigarettes  . Smokeless tobacco: None  . Alcohol Use: Yes  . Drug Use: Yes    Special: IV     Comment: heroin  . Sexual Activity: Not Asked   Other Topics Concern  . None   Social History Narrative   Additional Social History:    Allergies:   Allergies  Allergen Reactions  . Vancomycin Anaphylaxis    Labs:  Results for orders placed or performed during the hospital encounter of 11/01/15 (from the past 48 hour(s))  Urinalysis complete, with microscopic ([facility] only)     Status: Abnormal   Collection Time: 11/01/15 12:02 PM  Result Value Ref Range   Color, Urine YELLOW (A) YELLOW   APPearance CLEAR (A) CLEAR   Glucose, UA 50 (A) NEGATIVE mg/dL   Bilirubin Urine NEGATIVE NEGATIVE   Ketones, ur NEGATIVE NEGATIVE mg/dL   Specific Gravity, Urine 1.013 1.005 - 1.030   Hgb urine dipstick NEGATIVE NEGATIVE   pH 6.0 5.0 - 8.0   Protein, ur 100 (A) NEGATIVE mg/dL   Nitrite NEGATIVE NEGATIVE   Leukocytes, UA NEGATIVE NEGATIVE   RBC / HPF 0-5 0 - 5 RBC/hpf   WBC, UA 0-5 0 - 5 WBC/hpf   Bacteria, UA NONE SEEN NONE SEEN   Squamous Epithelial / LPF 0-5 (A) NONE SEEN   Mucous PRESENT    Hyaline Casts, UA PRESENT   CBC with Differential     Status: Abnormal   Collection Time: 11/01/15 12:02 PM  Result Value Ref Range   WBC 9.0 3.6 - 11.0 K/uL   RBC 5.44 (H) 3.80 - 5.20  MIL/uL   Hemoglobin 15.3 12.0 - 16.0 g/dL   HCT 47.1 (H) 35.0 - 47.0 %   MCV 86.6 80.0 - 100.0 fL   MCH 28.2 26.0 - 34.0 pg   MCHC 32.5 32.0 - 36.0 g/dL   RDW 16.9 (H) 11.5 - 14.5 %   Platelets 328 150 - 440 K/uL   Neutrophils Relative % 59% %   Neutro Abs 5.4 1.4 - 6.5 K/uL   Lymphocytes Relative 32% %   Lymphs Abs 2.9 1.0 - 3.6 K/uL   Monocytes Relative 4% %   Monocytes Absolute 0.4 0.2 - 0.9 K/uL   Eosinophils Relative 4% %   Eosinophils Absolute 0.3 0 - 0.7 K/uL   Basophils Relative 1% %   Basophils Absolute 0.1 0 - 0.1 K/uL  Comprehensive metabolic panel     Status: Abnormal   Collection Time: 11/01/15 12:02  PM  Result Value Ref Range   Sodium 137 135 - 145 mmol/L   Potassium 3.6 3.5 - 5.1 mmol/L   Chloride 107 101 - 111 mmol/L   CO2 21 (L) 22 - 32 mmol/L   Glucose, Bld 208 (H) 65 - 99 mg/dL   BUN 12 6 - 20 mg/dL   Creatinine, Ser 1.07 (H) 0.44 - 1.00 mg/dL   Calcium 8.3 (L) 8.9 - 10.3 mg/dL   Total Protein 7.3 6.5 - 8.1 g/dL   Albumin 3.6 3.5 - 5.0 g/dL   AST 31 15 - 41 U/L   ALT 24 14 - 54 U/L   Alkaline Phosphatase 76 38 - 126 U/L   Total Bilirubin 0.2 (L) 0.3 - 1.2 mg/dL   GFR calc non Af Amer >60 >60 mL/min   GFR calc Af Amer >60 >60 mL/min    Comment: (NOTE) The eGFR has been calculated using the CKD EPI equation. This calculation has not been validated in all clinical situations. eGFR's persistently <60 mL/min signify possible Chronic Kidney Disease.    Anion gap 9 5 - 15  Troponin I     Status: None   Collection Time: 11/01/15 12:02 PM  Result Value Ref Range   Troponin I <0.03 <0.031 ng/mL    Comment:        NO INDICATION OF MYOCARDIAL INJURY.   Lactic acid, plasma     Status: Abnormal   Collection Time: 11/01/15 12:02 PM  Result Value Ref Range   Lactic Acid, Venous 2.4 (HH) 0.5 - 2.0 mmol/L    Comment: CRITICAL RESULT CALLED TO, READ BACK BY AND VERIFIED WITH ANGELA ROBBINS ON 11/01/15 AT 1245 [place]   Blood culture (routine x 2)     Status: None  (Preliminary result)   Collection Time: 11/01/15 12:02 PM  Result Value Ref Range   Specimen Description BLOOD LEFT ARM    Special Requests BOTTLES DRAWN AEROBIC AND ANAEROBIC [place]    Culture  Setup Time      GRAM POSITIVE COCCI AEROBIC BOTTLE ONLY CRITICAL VALUE NOTED.  VALUE IS CONSISTENT WITH PREVIOUSLY REPORTED AND CALLED VALUE.    Culture NO GROWTH < 12 HOURS    Report Status PENDING   Blood culture (routine x 2)     Status: None (Preliminary result)   Collection Time: 11/01/15 12:02 PM  Result Value Ref Range   Specimen Description BLOOD RIGHT ARM    Special Requests BOTTLES DRAWN AEROBIC AND ANAEROBIC [place]    Culture  Setup Time      GRAM POSITIVE COCCI ANAEROBIC BOTTLE ONLY CRITICAL RESULT CALLED TO, READ BACK BY AND VERIFIED WITH: CALLED [place] ON 11/02/15 AT 0953 BY [facility]    Culture NO GROWTH < 12 HOURS    Report Status PENDING   Urine Drug Screen, Qualitative ([place] only)     Status: Abnormal   Collection Time: 11/01/15 12:02 PM  Result Value Ref Range   Tricyclic, Ur Screen NONE DETECTED NONE DETECTED   Amphetamines, Ur Screen NONE DETECTED NONE DETECTED   MDMA (Ecstasy)Ur Screen NONE DETECTED NONE DETECTED   Cocaine Metabolite,Ur [place] NONE DETECTED NONE DETECTED   Opiate, Ur Screen NONE DETECTED NONE DETECTED   Phencyclidine (PCP) Ur S NONE DETECTED NONE DETECTED   Cannabinoid 50 Ng, Ur [place] NONE DETECTED NONE DETECTED   Barbiturates, Ur Screen NONE DETECTED NONE DETECTED   Benzodiazepine, Ur Scrn POSITIVE (A) NONE DETECTED   Methadone Scn, Ur NONE DETECTED NONE DETECTED  Comment: (NOTE) 774  Tricyclics, urine               Cutoff 1000 ng/mL 200  Amphetamines, urine             Cutoff 1000 ng/mL 300  MDMA (Ecstasy), urine           Cutoff 500 ng/mL 400  Cocaine Metabolite, urine       Cutoff 300 ng/mL 500  Opiate, urine                   Cutoff 300 ng/mL 600  Phencyclidine (PCP), urine      Cutoff 25 ng/mL 700  Cannabinoid, urine              Cutoff 50  ng/mL 800  Barbiturates, urine             Cutoff 200 ng/mL 900  Benzodiazepine, urine           Cutoff 200 ng/mL 1000 Methadone, urine                Cutoff 300 ng/mL 1100 1200 The urine drug screen provides only a preliminary, unconfirmed 1300 analytical test result and should not be used for non-medical 1400 purposes. Clinical consideration and professional judgment should 1500 be applied to any positive drug screen result due to possible 1600 interfering substances. A more specific alternate chemical method 1700 must be used in order to obtain a confirmed analytical result.  1800 Gas chromato graphy / mass spectrometry (GC/MS) is the preferred 1900 confirmatory method.   Ethanol     Status: None   Collection Time: 11/01/15 12:02 PM  Result Value Ref Range   Alcohol, Ethyl (B) <5 <5 mg/dL    Comment:        LOWEST DETECTABLE LIMIT FOR SERUM ALCOHOL IS 5 mg/dL FOR MEDICAL PURPOSES ONLY   Acetaminophen level     Status: Abnormal   Collection Time: 11/01/15 12:02 PM  Result Value Ref Range   Acetaminophen (Tylenol), Serum <10 (L) 10 - 30 ug/mL    Comment:        THERAPEUTIC CONCENTRATIONS VARY SIGNIFICANTLY. A RANGE OF 10-30 ug/mL MAY BE AN EFFECTIVE CONCENTRATION FOR MANY PATIENTS. HOWEVER, SOME ARE BEST TREATED AT CONCENTRATIONS OUTSIDE THIS RANGE. ACETAMINOPHEN CONCENTRATIONS >150 ug/mL AT 4 HOURS AFTER INGESTION AND >50 ug/mL AT 12 HOURS AFTER INGESTION ARE OFTEN ASSOCIATED WITH TOXIC REACTIONS.   Salicylate level     Status: None   Collection Time: 11/01/15 12:02 PM  Result Value Ref Range   Salicylate Lvl <1.2 2.8 - 30.0 mg/dL  Lithium level     Status: Abnormal   Collection Time: 11/01/15 12:02 PM  Result Value Ref Range   Lithium Lvl <0.06 (L) 0.60 - 1.20 mmol/L  Protime-INR     Status: None   Collection Time: 11/01/15 12:02 PM  Result Value Ref Range   Prothrombin Time 14.0 11.4 - 15.0 seconds   INR 1.06   Blood Culture ID Panel (Reflexed)     Status:  Abnormal   Collection Time: 11/01/15 12:02 PM  Result Value Ref Range   Enterococcus species NOT DETECTED NOT DETECTED   Vancomycin resistance NOT DETECTED NOT DETECTED   Listeria monocytogenes NOT DETECTED NOT DETECTED   Staphylococcus species DETECTED (A) NOT DETECTED    Comment: CRITICAL RESULT CALLED TO, READ BACK BY AND VERIFIED WITH: CALLED SHEMA HALLEJI ON 11/02/15 AT 0953 BY [facility]    Staphylococcus aureus NOT DETECTED NOT DETECTED  Methicillin resistance DETECTED (A) NOT DETECTED    Comment: CRITICAL RESULT CALLED TO, READ BACK BY AND VERIFIED WITH: CALLED SHEMA HALLEJI ON 11/02/15 AT 0953 BY [facility]    Streptococcus species NOT DETECTED NOT DETECTED   Streptococcus agalactiae NOT DETECTED NOT DETECTED   Streptococcus pneumoniae NOT DETECTED NOT DETECTED   Streptococcus pyogenes NOT DETECTED NOT DETECTED   Acinetobacter baumannii NOT DETECTED NOT DETECTED   Enterobacteriaceae species NOT DETECTED NOT DETECTED   Enterobacter cloacae complex NOT DETECTED NOT DETECTED   Escherichia coli NOT DETECTED NOT DETECTED   Klebsiella oxytoca NOT DETECTED NOT DETECTED   Klebsiella pneumoniae NOT DETECTED NOT DETECTED   Proteus species NOT DETECTED NOT DETECTED   Serratia marcescens NOT DETECTED NOT DETECTED   Carbapenem resistance NOT DETECTED NOT DETECTED   Haemophilus influenzae NOT DETECTED NOT DETECTED   Neisseria meningitidis NOT DETECTED NOT DETECTED   Pseudomonas aeruginosa NOT DETECTED NOT DETECTED   Candida albicans NOT DETECTED NOT DETECTED   Candida glabrata NOT DETECTED NOT DETECTED   Candida krusei NOT DETECTED NOT DETECTED   Candida parapsilosis NOT DETECTED NOT DETECTED   Candida tropicalis NOT DETECTED NOT DETECTED  Blood gas, arterial     Status: Abnormal   Collection Time: 11/01/15 12:07 PM  Result Value Ref Range   FIO2 0.50    Delivery systems VENTILATOR    Mode ASSIST CONTROL    VT 500 mL   LHR 20 resp/min   Peep/cpap 5.0 cm H20   pH, Arterial 7.24 (L) 7.350 -  7.450   pCO2 arterial 51 (H) 32.0 - 48.0 mmHg   pO2, Arterial 84 83.0 - 108.0 mmHg   Bicarbonate 21.9 21.0 - 28.0 mEq/L   Acid-base deficit 6.0 (H) 0.0 - 2.0 mmol/L   O2 Saturation 94.2 %   Patient temperature 37.0    Collection site RIGHT RADIAL    Sample type ARTERIAL DRAW    Allens test (pass/fail) POSITIVE (A) PASS   Mechanical Rate 20   Glucose, capillary     Status: None   Collection Time: 11/01/15  4:10 PM  Result Value Ref Range   Glucose-Capillary 76 65 - 99 mg/dL  Lactic acid, plasma     Status: None   Collection Time: 11/01/15  4:51 PM  Result Value Ref Range   Lactic Acid, Venous 1.1 0.5 - 2.0 mmol/L  Magnesium     Status: None   Collection Time: 11/01/15  4:51 PM  Result Value Ref Range   Magnesium 1.9 1.7 - 2.4 mg/dL  Phosphorus     Status: None   Collection Time: 11/01/15  4:51 PM  Result Value Ref Range   Phosphorus 3.9 2.5 - 4.6 mg/dL  Heparin level (unfractionated)     Status: None   Collection Time: 11/01/15  5:39 PM  Result Value Ref Range   Heparin Unfractionated 0.51 0.30 - 0.70 IU/mL    Comment:        IF HEPARIN RESULTS ARE BELOW EXPECTED VALUES, AND PATIENT DOSAGE HAS BEEN CONFIRMED, SUGGEST FOLLOW UP TESTING OF ANTITHROMBIN III LEVELS.   CBC     Status: Abnormal   Collection Time: 11/01/15  5:39 PM  Result Value Ref Range   WBC 12.9 (H) 3.6 - 11.0 K/uL   RBC 5.11 3.80 - 5.20 MIL/uL   Hemoglobin 14.8 12.0 - 16.0 g/dL   HCT 45.2 35.0 - 47.0 %   MCV 88.4 80.0 - 100.0 fL   MCH 28.9 26.0 - 34.0 pg   MCHC  32.7 32.0 - 36.0 g/dL   RDW 16.7 (H) 11.5 - 14.5 %   Platelets 281 150 - 440 K/uL  Basic metabolic panel     Status: Abnormal   Collection Time: 11/01/15  5:39 PM  Result Value Ref Range   Sodium 138 135 - 145 mmol/L   Potassium 5.0 3.5 - 5.1 mmol/L   Chloride 112 (H) 101 - 111 mmol/L   CO2 20 (L) 22 - 32 mmol/L   Glucose, Bld 85 65 - 99 mg/dL   BUN 12 6 - 20 mg/dL   Creatinine, Ser 0.82 0.44 - 1.00 mg/dL   Calcium 8.0 (L) 8.9 - 10.3  mg/dL   GFR calc non Af Amer >60 >60 mL/min   GFR calc Af Amer >60 >60 mL/min    Comment: (NOTE) The eGFR has been calculated using the CKD EPI equation. This calculation has not been validated in all clinical situations. eGFR's persistently <60 mL/min signify possible Chronic Kidney Disease.    Anion gap 6 5 - 15  Glucose, capillary     Status: None   Collection Time: 11/01/15  7:41 PM  Result Value Ref Range   Glucose-Capillary 80 65 - 99 mg/dL   Comment 1 Notify RN   Heparin level (unfractionated)     Status: None   Collection Time: 11/01/15  9:51 PM  Result Value Ref Range   Heparin Unfractionated 0.61 0.30 - 0.70 IU/mL    Comment:        IF HEPARIN RESULTS ARE BELOW EXPECTED VALUES, AND PATIENT DOSAGE HAS BEEN CONFIRMED, SUGGEST FOLLOW UP TESTING OF ANTITHROMBIN III LEVELS.   Triglycerides     Status: Abnormal   Collection Time: 11/01/15  9:51 PM  Result Value Ref Range   Triglycerides 244 (H) <150 mg/dL  Glucose, capillary     Status: None   Collection Time: 11/01/15 11:24 PM  Result Value Ref Range   Glucose-Capillary 86 65 - 99 mg/dL   Comment 1 Notify RN   Glucose, capillary     Status: None   Collection Time: 11/02/15  3:32 AM  Result Value Ref Range   Glucose-Capillary 83 65 - 99 mg/dL   Comment 1 Notify RN   Blood gas, arterial     Status: Abnormal   Collection Time: 11/02/15  5:00 AM  Result Value Ref Range   FIO2 0.40    Delivery systems VENTILATOR    Mode PRESSURE REGULATED VOLUME CONTROL    VT 500 mL   Peep/cpap 5.0 cm H20   pH, Arterial 7.36 7.350 - 7.450   pCO2 arterial 44 32.0 - 48.0 mmHg   pO2, Arterial 148 (H) 83.0 - 108.0 mmHg   Bicarbonate 24.9 21.0 - 28.0 mEq/L   Acid-base deficit 0.8 0.0 - 2.0 mmol/L   O2 Saturation 99.2 %   Patient temperature 37.0    Collection site RIGHT RADIAL    Sample type ARTERIAL DRAW    Allens test (pass/fail) PASS PASS   Mechanical Rate 12   CBC     Status: Abnormal   Collection Time: 11/02/15  5:07 AM   Result Value Ref Range   WBC 11.4 (H) 3.6 - 11.0 K/uL   RBC 4.95 3.80 - 5.20 MIL/uL   Hemoglobin 14.0 12.0 - 16.0 g/dL   HCT 43.3 35.0 - 47.0 %   MCV 87.3 80.0 - 100.0 fL   MCH 28.3 26.0 - 34.0 pg   MCHC 32.4 32.0 - 36.0 g/dL   RDW 16.9 (H)  11.5 - 14.5 %   Platelets 231 150 - 440 K/uL  Basic metabolic panel     Status: Abnormal   Collection Time: 11/02/15  5:07 AM  Result Value Ref Range   Sodium 139 135 - 145 mmol/L   Potassium 4.5 3.5 - 5.1 mmol/L   Chloride 109 101 - 111 mmol/L   CO2 21 (L) 22 - 32 mmol/L   Glucose, Bld 94 65 - 99 mg/dL   BUN 17 6 - 20 mg/dL   Creatinine, Ser 1.01 (H) 0.44 - 1.00 mg/dL   Calcium 8.3 (L) 8.9 - 10.3 mg/dL   GFR calc non Af Amer >60 >60 mL/min   GFR calc Af Amer >60 >60 mL/min    Comment: (NOTE) The eGFR has been calculated using the CKD EPI equation. This calculation has not been validated in all clinical situations. eGFR's persistently <60 mL/min signify possible Chronic Kidney Disease.    Anion gap 9 5 - 15  Magnesium     Status: None   Collection Time: 11/02/15  5:07 AM  Result Value Ref Range   Magnesium 2.1 1.7 - 2.4 mg/dL  Phosphorus     Status: None   Collection Time: 11/02/15  5:07 AM  Result Value Ref Range   Phosphorus 3.7 2.5 - 4.6 mg/dL  Glucose, capillary     Status: None   Collection Time: 11/02/15  7:04 AM  Result Value Ref Range   Glucose-Capillary 97 65 - 99 mg/dL  CBC     Status: Abnormal   Collection Time: 11/02/15  8:24 AM  Result Value Ref Range   WBC 10.1 3.6 - 11.0 K/uL   RBC 4.81 3.80 - 5.20 MIL/uL   Hemoglobin 13.8 12.0 - 16.0 g/dL   HCT 42.4 35.0 - 47.0 %   MCV 88.1 80.0 - 100.0 fL   MCH 28.8 26.0 - 34.0 pg   MCHC 32.7 32.0 - 36.0 g/dL   RDW 17.0 (H) 11.5 - 14.5 %   Platelets 250 150 - 440 K/uL  Heparin level (unfractionated)     Status: None   Collection Time: 11/02/15  8:24 AM  Result Value Ref Range   Heparin Unfractionated 0.38 0.30 - 0.70 IU/mL    Comment:        IF HEPARIN RESULTS ARE  BELOW EXPECTED VALUES, AND PATIENT DOSAGE HAS BEEN CONFIRMED, SUGGEST FOLLOW UP TESTING OF ANTITHROMBIN III LEVELS.   Glucose, capillary     Status: None   Collection Time: 11/02/15 11:46 AM  Result Value Ref Range   Glucose-Capillary 95 65 - 99 mg/dL    Current Facility-Administered Medications  Medication Dose Route Frequency Provider Last Rate Last Dose  . 0.9 %  sodium chloride infusion  250 mL Intravenous PRN Laverle Hobby, MD      . acetaminophen (TYLENOL) tablet 650 mg  650 mg Oral Q4H PRN Laverle Hobby, MD      . antiseptic oral rinse solution (CORINZ)  7 mL Mouth Rinse 10 times per day Laverle Hobby, MD   7 mL at 11/02/15 7893  . chlorhexidine gluconate (SAGE KIT) (PERIDEX) 0.12 % solution 15 mL  15 mL Mouth Rinse BID Laverle Hobby, MD   15 mL at 11/02/15 0800  . heparin ADULT infusion 100 units/mL (25000 units/231m sodium chloride 0.45%)  1,500 Units/hr Intravenous Continuous DOrbie Pyo MD   0 Units/hr at 11/02/15 [phone] . nicotine (NICODERM CQ - dosed in mg/24 hours) patch 21 mg  21 mg Transdermal Daily Mayreli Alden  T Kailah Pennel, MD   21 mg at 11/02/15 1114  . ondansetron (ZOFRAN) injection 4 mg  4 mg Intravenous Q6H PRN Laverle Hobby, MD      . pantoprazole sodium (PROTONIX) 40 mg/20 mL oral suspension 40 mg  40 mg Per Tube Daily Laverle Hobby, MD   40 mg at 11/01/15 1730  . sulfamethoxazole-trimethoprim (BACTRIM DS,SEPTRA DS) 800-160 MG per tablet 2 tablet  2 tablet Oral Q12H Sheema Lorrin Mais, RPH        Musculoskeletal: Strength & Muscle Tone: decreased Gait & Station: unable to stand Patient leans: Backward  Psychiatric Specialty Exam: Physical Exam  Constitutional: She appears well-developed and well-nourished. She is uncooperative. She is easily aroused. She has a sickly appearance. She appears distressed.  HENT:  Head: Normocephalic and atraumatic.  Eyes: Conjunctivae are normal. Pupils are equal, round, and reactive to  light.  Neck: Normal range of motion.  Cardiovascular: Normal heart sounds.   Respiratory: Effort normal.  GI: Soft.  Musculoskeletal: Normal range of motion.  Neurological: She is alert and easily aroused.  Skin: Skin is warm and dry.  Psychiatric: Her mood appears anxious. Her affect is angry and labile. Her speech is rapid and/or pressured and slurred. She is agitated and aggressive. Thought content is paranoid. She expresses impulsivity. She exhibits a depressed mood. She expresses suicidal ideation. She is noncommunicative. She exhibits abnormal recent memory and abnormal remote memory.    Review of Systems  Constitutional: Negative.   HENT: Negative.   Eyes: Negative.   Respiratory: Negative.   Cardiovascular: Negative.   Gastrointestinal: Positive for nausea and abdominal pain.  Musculoskeletal: Negative.        Patient is complaining of severe pain in both of her legs particularly in her left upper leg.  Skin: Negative.   Neurological: Negative.   Psychiatric/Behavioral: Positive for depression, suicidal ideas, memory loss and substance abuse. Negative for hallucinations. The patient is nervous/anxious and has insomnia.     Blood pressure 118/73, pulse 100, temperature 99.9 F (37.7 C), temperature source Core (Comment), resp. rate 15, height 5' 7"  (1.702 m), weight 137.4 kg (302 lb 14.6 oz), SpO2 98 %.Body mass index is 47.43 kg/(m^2).  General Appearance: Disheveled  Eye Contact:  Minimal  Speech:  Garbled and Pressured  Volume:  Increased  Mood:  Anxious, Depressed and Irritable  Affect:  Inappropriate and Labile  Thought Process:  Disorganized  Orientation:  Full (Time, Place, and Person)  Thought Content:  Illogical, Paranoid Ideation and Rumination  Suicidal Thoughts:  Yes.  with intent/plan  Homicidal Thoughts:  No  Memory:  Immediate;   Fair Recent;   Poor Remote;   Poor  Judgement:  Impaired  Insight:  Shallow  Psychomotor Activity:  Restlessness   Concentration:  Concentration: Poor  Recall:  Poor  Fund of Knowledge:  Poor  Language:  Poor  Akathisia:  No  Handed:  Right  AIMS (if indicated):     Assets:  Financial Resources/Insurance Housing Social Support  ADL's:  Impaired  Cognition:  Impaired,  Mild  Sleep:        Treatment Plan Summary: Daily contact with patient to assess and evaluate symptoms and progress in treatment, Medication management and Plan 40 year old woman came into the hospital with evidently both a pulmonary embolism and an overdose on narcotics. She is currently stating that she doesn't care whether she dies and that she plans to go home and continue to abuse drugs whether it kills her or not. She makes  comments at some points about even wanting to die. Affect is labile and out of control and she is hard pressed to have a coherent conversation. This could be her bipolar disorder could be opiate withdrawal. Could be the pain although I think that's only one factor going on here. Patient continues to meet commitment criteria. Cannot discontinue the IVC. I talked with the doctor in the CCU who advises that she be in the hospital for at least 1 more day to get her coagulation taken care of. I told the patient that we could manage her pain and agitation right now by giving her 3 days of detox with methadone. We will do this because she has no choice at this point except to get completely detoxed and because she is not apparently able to tolerate buprenorphine. I'm holding off on restarting the lithium all she still is having some renal problems. I will continue to follow-up and monitor her. It is possible that we may need to transfer her to psychiatry depending on her mental state and behavior over the next day.  Disposition: Supportive therapy provided about ongoing stressors. Discussed crisis plan, support from social network, calling 911, coming to the Emergency Department, and calling Suicide Hotline.  Alethia Berthold,  MD 11/02/2015 1:24 PM

## 2015-11-02 NOTE — Consult Note (Signed)
  Psychiatry: Follow-up on this morning's note. Spoke with nursing taking care of her right now. Chart reviewed. Patient is reported to be better in her behavior but still has moments of agitation and argumentativeness. She is requesting more methadone. No sign that she is being oversedated by her medicine oxygen appears to be steady.  I am ordering another 20 mg of methadone tonight. I believe her outpatient dose she was taking it was over 100 daily. She certainly can tolerate another 20 tonight. I will go with 20 mg of methadone twice a day starting tomorrow morning for 2 further days. Also going to give her a little bits of Librium tonight and through tomorrow as well. This may help to keep her relaxed enough for appropriate medical treatment. I will reevaluate tomorrow.

## 2015-11-02 NOTE — Progress Notes (Signed)
Pts Husband arrived.  He reports finding "Synthetic Heroin" and another "Unidentifiable drug" as well as xanax at home.  He is Angry, and agitated.

## 2015-11-02 NOTE — BH Assessment (Signed)
Writer informed psychiatrist (Dr. Clapacs) of consult. 

## 2015-11-02 NOTE — Progress Notes (Signed)
US tech here to attempt Ultrasound of extremities.  Pt refused due to pain.

## 2015-11-02 NOTE — Progress Notes (Signed)
Pt reaching for tube, sedation increased and mittens applied to patients hands.

## 2015-11-02 NOTE — Progress Notes (Signed)
ANTICOAGULATION CONSULT NOTE -follow up Consult  Pharmacy Consult for Heparin Infusion  Indication: pulmonary embolus  Allergies  Allergen Reactions  . Vancomycin Anaphylaxis    Patient Measurements: Height: 5\' 7"  (170.2 cm) Weight: (!) 302 lb 14.6 oz (137.4 kg) IBW/kg (Calculated) : 61.6 Heparin Dosing Weight: 95 kg  Vital Signs: Temp: 99.3 F (37.4 C) (06/04 0600) Temp Source: Core (Comment) (06/04 0300) BP: 99/66 mmHg (06/04 0600) Pulse Rate: 66 (06/04 0600)  Labs:  Recent Labs  11/01/15 1202 11/01/15 1739 11/01/15 2151 11/02/15 0507  HGB 15.3 14.8  --  14.0  HCT 47.1* 45.2  --  43.3  PLT 328 281  --  231  LABPROT 14.0  --   --   --   INR 1.06  --   --   --   HEPARINUNFRC  --  0.51 0.61  --   CREATININE 1.07* 0.82  --  1.01*  TROPONINI <0.03  --   --   --     Estimated Creatinine Clearance: 106.3 mL/min (by C-G formula based on Cr of 1.01).   Medical History: Past Medical History  Diagnosis Date  . Chronic back pain   . Vascular disease   . DVT (deep venous thrombosis) (HCC)   . Bipolar 1 disorder [facility](HCC)     Assessment: 40 yo patient with PMH of polysubstance abuse and overdose.  CT of chest showing LLL filling defect constant with PE. Pharmacy consulted for dosing and monitoring of heparin.   Patient was prescribed warfarin outpatient but PT was 14 and INR 1.06 on admission.    Goal of Therapy:  Heparin level 0.3-0.7 units/ml Monitor platelets by anticoagulation protocol: Yes   Plan:  Give 4000 units bolus x 1 Start heparin infusion at 1500 units/hr Check anti-Xa level in 6 hours and daily while on heparin Continue to monitor H&H and platelets   6/3- Heparin level obtained at 1739 drawn early (~2.5 hours after drip started instead of 6 hours). HL of 0.51 is within therapeutic range - will continue with current rate of 1500 units/hr and reorder heparin level for 2115 which will represent a 6 hour level.  6/3- HL at 2151=0.61. Heparin drip  continued.   6/4- CBC/Heparin level ordered for this am for verification of therapeutic level. Will follow up.    Bari MantisKristin Seldon Barrell PharmD Clinical Pharmacist 11/02/2015 ,7:31 AM

## 2015-11-02 NOTE — Progress Notes (Signed)
Pt extubated by RT.  Pt states "I'm gonna leave AMA, Get this stuff off of me"  Dr. Ardyth Manam made aware.

## 2015-11-02 NOTE — Progress Notes (Signed)
ANTICOAGULATION CONSULT NOTE -follow up Consult  Pharmacy Consult for Heparin Infusion  Indication: pulmonary embolus  Allergies  Allergen Reactions  . Vancomycin Anaphylaxis    Patient Measurements: Height: 5\' 7"  (170.2 cm) Weight: (!) 302 lb 14.6 oz (137.4 kg) IBW/kg (Calculated) : 61.6 Heparin Dosing Weight: 95 kg  Vital Signs: Temp: 99.5 F (37.5 C) (06/04 0802) Temp Source: Core (Comment) (06/04 0300) BP: 99/44 mmHg (06/04 0802) Pulse Rate: 81 (06/04 0802)  Labs:  Recent Labs  11/01/15 1202 11/01/15 1739 11/01/15 2151 11/02/15 0507 11/02/15 0824  HGB 15.3 14.8  --  14.0 13.8  HCT 47.1* 45.2  --  43.3 42.4  PLT 328 281  --  231 250  LABPROT 14.0  --   --   --   --   INR 1.06  --   --   --   --   HEPARINUNFRC  --  0.51 0.61  --  0.38  CREATININE 1.07* 0.82  --  1.01*  --   TROPONINI <0.03  --   --   --   --     Estimated Creatinine Clearance: 106.3 mL/min (by C-G formula based on Cr of 1.01).   Medical History: Past Medical History  Diagnosis Date  . Chronic back pain   . Vascular disease   . DVT (deep venous thrombosis) (HCC)   . Bipolar 1 disorder [facility](HCC)     Assessment: 40 yo patient with PMH of polysubstance abuse and overdose.  CT of chest showing LLL filling defect constant with PE. Pharmacy consulted for dosing and monitoring of heparin.   Patient was prescribed warfarin outpatient but PT was 14 and INR 1.06 on admission.    Goal of Therapy:  Heparin level 0.3-0.7 units/ml Monitor platelets by anticoagulation protocol: Yes   Plan:  Give 4000 units bolus x 1 Start heparin infusion at 1500 units/hr Check anti-Xa level in 6 hours and daily while on heparin Continue to monitor H&H and platelets   6/3- Heparin level obtained at 1739 drawn early (~2.5 hours after drip started instead of 6 hours). HL of 0.51 is within therapeutic range - will continue with current rate of 1500 units/hr and reorder heparin level for 2115 which will represent a 6  hour level.  6/3- HL at 2151=0.61. Heparin drip continued.   6/4 HL at 0830=0.38. Will continue current rate of 1500 units/hr and recheck with am labs.    Bari MantisKristin Clevland Cork PharmD Clinical Pharmacist 11/02/2015 ,9:05 AM

## 2015-11-02 NOTE — Progress Notes (Signed)
Pt continues to be belligerent.  Officer remains at bedside.  Pt telling sitter "Go fuck yourself."  Husband on phone attempting to console patient.  Unsuccessfull at this time.

## 2015-11-03 ENCOUNTER — Inpatient Hospital Stay (HOSPITAL_COMMUNITY)
Admit: 2015-11-03 | Discharge: 2015-11-03 | Disposition: A | Discharge status: Home / Self Care | Payer: Self-pay | Attending: Internal Medicine | Admitting: Internal Medicine

## 2015-11-03 DIAGNOSIS — I2699 Other pulmonary embolism without acute cor pulmonale: Secondary | ICD-10-CM

## 2015-11-03 LAB — ECHOCARDIOGRAM COMPLETE
Height: 67 in
WEIGHTICAEL: 4819.2 [oz_av]

## 2015-11-03 LAB — CBC
HCT: 44.3 % (ref 35.0–47.0)
HEMOGLOBIN: 14.5 g/dL (ref 12.0–16.0)
MCH: 28.5 pg (ref 26.0–34.0)
MCHC: 32.8 g/dL (ref 32.0–36.0)
MCV: 86.9 fL (ref 80.0–100.0)
PLATELETS: 257 10*3/uL (ref 150–440)
RBC: 5.09 MIL/uL (ref 3.80–5.20)
RDW: 16.9 % — AB (ref 11.5–14.5)
WBC: 6.7 10*3/uL (ref 3.6–11.0)

## 2015-11-03 LAB — MAGNESIUM: MAGNESIUM: 2 mg/dL (ref 1.7–2.4)

## 2015-11-03 LAB — PHOSPHORUS: PHOSPHORUS: 4.5 mg/dL (ref 2.5–4.6)

## 2015-11-03 MED ORDER — RIVAROXABAN 20 MG PO TABS: 20.0000 mg | ORAL_TABLET | Freq: Every day | ORAL | Status: DC

## 2015-11-03 MED ORDER — RIVAROXABAN 15 MG PO TABS: 15.0000 mg | ORAL_TABLET | Freq: Two times a day (BID) | ORAL | Status: DC

## 2015-11-03 NOTE — Care Management Note (Signed)
Case Management Note  Patient Details  Name: Courtney Hebert MRN: [id number] Date of Birth: [birth year]  Subjective/Objective:       Provided Mrs Courtney Hebert with a list of local clinics and applications to the [facility] and [facility]. She is lying in bed with her 1:1 sitter at bedside.             Action/Plan:   Expected Discharge Date:                  Expected Discharge Plan:     In-House Referral:     Discharge planning Services     Post Acute Care Choice:    Choice offered to:     DME Arranged:    DME Agency:     HH Arranged:    HH Agency:     Status of Service:     Medicare Important Message Given:    Date Medicare IM Given:    Medicare IM give by:    Date Additional Medicare IM Given:    Additional Medicare Important Message give by:     If discussed at Long Length of Stay Meetings, dates discussed:    Additional Comments:  Corrine Tillis A, RN 11/03/2015, 8:16 AM

## 2015-11-03 NOTE — Progress Notes (Signed)
Patient wants to leave AMA and wants to talk to dr. Dr Allena KatzPatel notified. States he will call in the room and talk to her.

## 2015-11-03 NOTE — Progress Notes (Signed)
Dr.Diamond returned page new orders given. Patient resting between care no acute distress noted. Sitter at bedside. Tele notified

## 2015-11-03 NOTE — Discharge Instructions (Signed)
°  DIET:  °Regular diet ° °DISCHARGE CONDITION:  °Stable ° °ACTIVITY:  °Activity as tolerated ° °OXYGEN:  °Home Oxygen: No. °  °Oxygen Delivery: room air ° °DISCHARGE LOCATION:  °[facility]  ° ° °ADDITIONAL DISCHARGE INSTRUCTION: ° ° °If you experience worsening of your admission symptoms, develop shortness of breath, life threatening emergency, suicidal or homicidal thoughts you must seek medical attention immediately by calling 911 or calling your MD immediately  if symptoms less severe. ° °You Must read complete instructions/literature along with all the possible adverse reactions/side effects for all the Medicines you take and that have been prescribed to you. Take any new Medicines after you have completely understood and accpet all the possible adverse reactions/side effects.  ° °Please note ° °You were cared for by a hospitalist during your hospital stay. If you have any questions about your discharge medications or the care you received while you were in the hospital after you are discharged, you can call the unit and asked to speak with the hospitalist on call if the hospitalist that took care of you is not available. Once you are discharged, your primary care physician will handle any further medical issues. Please note that NO REFILLS for any discharge medications will be authorized once you are discharged, as it is imperative that you return to your primary care physician (or establish a relationship with a primary care physician if you do not have one) for your aftercare needs so that they can reassess your need for medications and monitor your lab values. ° ° °

## 2015-11-03 NOTE — Progress Notes (Signed)
Patient states she feels anxious and wants an ativan. Dr Toni Amendlapacs paged. Waiting on callback.

## 2015-11-03 NOTE — Discharge Summary (Signed)
Courtney Hebert, 40 y.o., DOB [birth year], MRN [id number]. Admission date: 11/01/2015 Discharge Date 11/03/2015 Primary MD No PCP Per Patient Admitting Physician Courtney Bilberry, MD  Admission Diagnosis  Pulmonary emboli (HCC) [I26.99] Altered mental status, unspecified altered mental status type [R41.82] Other acute pulmonary embolism without acute cor pulmonale (HCC) [I26.99]  Discharge Diagnosis   Principal Problem: Acute respiratory failure   Acute encephalopathy with decrease in responsiveness Bipolar affective disorder, currently depressed, moderate (HCC) Opiate dependence (HCC) Acute Pulmonary embolism (HCC)   Possible Opiate overdose   Suicidal ideation   Involuntary commitment   Benzodiazepine abuse         Hospital Course Patient is a 40 year old white female who was admitted after family heard a loud thump and her falling when they arrived in the room they found her fallen against the nightstand she was unresponsive and turning blue. Patient was brought to the ED and had to be intubated. Subsequent workup did reveal her to have pulmonary embolism. Patient was supposed to be taking Coumadin but INR was normal. She was started on Xarelto per pulmonary. Which is currently continued she is asymptomatic from that standpoint. She was able to be extubated shortly. Patient was on the intensivist service. And subsequently transferred to medicine service. During the hospitalization she was also seen by psychiatry. There is a concern that she overdosed initially on the medications. They treated her with methadone during the hospitalization for short period of time. They feel that she needs inpatient psychiatric admission. Which is currently being arranged.             Consults  psychiatry  Significant Tests:  See full reports for all details      Dg Pelvis 1-2 Views  11/01/2015  CLINICAL DATA:  Status post intubation.  Unresponsive. EXAM: PELVIS - 1-2 VIEW COMPARISON:  Abdominal  radiographs 10/04/2015 and 06/17/2013 FINDINGS: No fracture is identified. Femoral heads are approximated with the acetabula on this single AP projection, with mild bilateral hip joint spurring noted. A monitoring lead overlies the bladder. IMPRESSION: No acute osseous abnormality identified. Electronically Signed   By: Sebastian Ache M.D.   On: 11/01/2015 12:48   Ct Head Wo Contrast  11/01/2015  CLINICAL DATA:  Found unresponsive.  Complains of lower leg pain. EXAM: CT HEAD WITHOUT CONTRAST CT CERVICAL SPINE WITHOUT CONTRAST TECHNIQUE: Multidetector CT imaging of the head and cervical spine was performed following the standard protocol without intravenous contrast. Multiplanar CT image reconstructions of the cervical spine were also generated. COMPARISON:  None. FINDINGS: CT HEAD FINDINGS No acute cortical infarct, hemorrhage, or mass lesion ispresent. Ventricles are of normal size. No significant extra-axial fluid collection is present. The paranasal sinuses andmastoid air cells are clear. The osseous skull is intact. Mild mucosal thickening involving the left sphenoid sinus noted. There is a chronic fracture deformity involving the medial wall of the right orbit. CT CERVICAL SPINE FINDINGS Normal alignment of the cervical spine. The vertebral body heights are well preserved. The facet joints are well-aligned. The prevertebral soft tissue space is normal. No acute fracture or subluxation identified. There is no fracture or subluxation identified. IMPRESSION: 1. No acute intracranial abnormality. 2. No evidence for cervical spine fracture or subluxation. Electronically Signed   By: Signa Kell M.D.   On: 11/01/2015 13:23   Ct Angio Chest Pe W/cm &/or Wo Cm  11/01/2015  CLINICAL DATA:  Found unresponsive.  Tachycardia. EXAM: CT ANGIOGRAPHY CHEST WITH CONTRAST TECHNIQUE: Multidetector CT imaging of the chest was performed  using the standard protocol during bolus administration of intravenous contrast.  Multiplanar CT image reconstructions and MIPs were obtained to evaluate the vascular anatomy. CONTRAST:  75 cc Isovue 370 IV COMPARISON:  11/01/2015 FINDINGS: Mediastinum/Nodes: Heart is borderline enlarged. Aorta is normal caliber. No dissection. Small scattered mediastinal lymph nodes. No mediastinal, hilar, or axillary adenopathy. There are filling defects within the left lower lobe pulmonary artery extending into segmental branches posteriorly compatible with pulmonary emboli. Probable small pulmonary emboli in the upper lobe of the right lung. No evidence of right heart strain. Lungs/Pleura: Endotracheal tube in place with the tip just above the carina. Areas of atelectasis in the lower lobes bilaterally. Probable atelectasis posteriorly and medially in the right upper lobe. Mild ground-glass opacities and interlobular septal thickening may reflect interstitial edema. No visible effusions. Upper abdomen: Imaging into the upper abdomen shows no acute findings. NG tube in the stomach. Musculoskeletal: Chest wall soft tissues are unremarkable. No acute bony abnormality. Review of the MIP images confirms the above findings. IMPRESSION: Pulmonary emboli involving the left lower lobe and likely small pulmonary emboli in the right upper lobe. Borderline cardiomegaly. Areas of ground-glass opacities and interlobular septal thickening could reflect early interstitial edema. Airspace opacities in the lower lobes likely reflect atelectasis. Airspace opacity in the right upper lobe could reflect atelectasis or infiltrate. Endotracheal tube tip just above the carina. Critical Value/emergent results were called by telephone at the time of interpretation on 11/01/2015 at 1:23 pm to Dr. Gladstone Pih , who verbally acknowledged these results. Electronically Signed   By: Charlett Nose M.D.   On: 11/01/2015 13:25   Ct Cervical Spine Wo Contrast  11/01/2015  CLINICAL DATA:  Found unresponsive.  Complains of lower leg pain. EXAM:  CT HEAD WITHOUT CONTRAST CT CERVICAL SPINE WITHOUT CONTRAST TECHNIQUE: Multidetector CT imaging of the head and cervical spine was performed following the standard protocol without intravenous contrast. Multiplanar CT image reconstructions of the cervical spine were also generated. COMPARISON:  None. FINDINGS: CT HEAD FINDINGS No acute cortical infarct, hemorrhage, or mass lesion ispresent. Ventricles are of normal size. No significant extra-axial fluid collection is present. The paranasal sinuses andmastoid air cells are clear. The osseous skull is intact. Mild mucosal thickening involving the left sphenoid sinus noted. There is a chronic fracture deformity involving the medial wall of the right orbit. CT CERVICAL SPINE FINDINGS Normal alignment of the cervical spine. The vertebral body heights are well preserved. The facet joints are well-aligned. The prevertebral soft tissue space is normal. No acute fracture or subluxation identified. There is no fracture or subluxation identified. IMPRESSION: 1. No acute intracranial abnormality. 2. No evidence for cervical spine fracture or subluxation. Electronically Signed   By: Signa Kell M.D.   On: 11/01/2015 13:23   US Venous Img Lower Bilateral  10/06/2015  CLINICAL DATA:  40 year old female with a history of swelling EXAM: BILATERAL LOWER EXTREMITY VENOUS DOPPLER ULTRASOUND TECHNIQUE: Gray-scale sonography with graded compression, as well as color Doppler and duplex ultrasound were performed to evaluate the lower extremity deep venous systems from the level of the common femoral vein and including the common femoral, femoral, profunda femoral, popliteal and calf veins including the posterior tibial, peroneal and gastrocnemius veins when visible. The superficial great saphenous vein was also interrogated. Spectral Doppler was utilized to evaluate flow at rest and with distal augmentation maneuvers in the common femoral, femoral and popliteal veins. COMPARISON:   10/29/2013 FINDINGS: RIGHT LOWER EXTREMITY Common Femoral Vein: No evidence of thrombus.  Normal compressibility, respiratory phasicity and response to augmentation. Saphenofemoral Junction: No evidence of thrombus. Normal compressibility and flow on color Doppler imaging. Profunda Femoral Vein: No evidence of thrombus. Normal compressibility and flow on color Doppler imaging. Femoral Vein: No evidence of thrombus. Normal compressibility, respiratory phasicity and response to augmentation. Popliteal Vein: No evidence of thrombus. Normal compressibility, respiratory phasicity and response to augmentation. Calf Veins: Calf veins not imaged given the patient's discomfort worth the exam. Superficial Great Saphenous Vein: No evidence of thrombus. Normal compressibility and flow on color Doppler imaging. Other Findings:  None. LEFT LOWER EXTREMITY Common Femoral Vein: No evidence of thrombus. Normal compressibility, respiratory phasicity and response to augmentation. Saphenofemoral Junction: No evidence of thrombus. Normal compressibility and flow on color Doppler imaging. Profunda Femoral Vein: No evidence of thrombus. Normal compressibility and flow on color Doppler imaging. Femoral Vein: No evidence of thrombus. Normal compressibility, respiratory phasicity and response to augmentation. Popliteal Vein: Popliteal vein not image given the patient's discomfort. Calf Veins: Calf veins not imaged given the patient's discomfort. Superficial Great Saphenous Vein: No evidence of thrombus. Normal compressibility and flow on color Doppler imaging. Other Findings:  None. IMPRESSION: Sonographic survey of the bilateral lower extremities negative for DVT. Bilateral calf veins and the left popliteal vein not imaged given the patient's discomfort with the exam. Signed, Yvone Neu. Loreta Ave, DO [facility] Electronically Signed   By: Gilmer Mor D.O.   On: 10/06/2015 14:15   Dg  Chest Portable 1 View  11/01/2015  CLINICAL DATA:  Post intubation. EXAM: PORTABLE CHEST 1 VIEW COMPARISON:  10/04/2015 FINDINGS: Interval removal of right central line. NG tube and endotracheal tube are in place, unchanged. Improving aeration in the lungs. Minimal residual left base atelectasis. No confluent opacity on the right. Heart is normal size. IMPRESSION: Improving aeration in the lungs. Minimal residual left base atelectasis. Electronically Signed   By: Charlett Nose M.D.   On: 11/01/2015 12:42       Today   Subjective:   Giavanni Zeitlin  Was to go home signed out AGAINST MEDICAL ADVICE   Objective:   Blood pressure 156/94, pulse 84, temperature 97.7 F (36.5 C), temperature source Oral, resp. rate 21, height 5\' 7"  (1.702 m), weight 136.623 kg (301 lb 3.2 oz), SpO2 94 %.  .  Intake/Output Summary (Last 24 hours) at 11/03/15 1242 Last data filed at 11/03/15 1101  Gross per 24 hour  Intake    360 ml  Output   2050 ml  Net  -1690 ml    Exam VITAL SIGNS: Blood pressure 156/94, pulse 84, temperature 97.7 F (36.5 C), temperature source Oral, resp. rate 21, height 5\' 7"  (1.702 m), weight 136.623 kg (301 lb 3.2 oz), SpO2 94 %.  GENERAL:  40 y.o.-year-old patient lying in the bed with no acute distress.  EYES: Pupils equal, round, reactive to light and accommodation. No scleral icterus. Extraocular muscles intact.  HEENT: Head atraumatic, normocephalic. Oropharynx and nasopharynx clear.  NECK:  Supple, no jugular venous distention. No thyroid enlargement, no tenderness.  LUNGS: Normal breath sounds bilaterally, no wheezing, rales,rhonchi or crepitation. No use of accessory muscles of respiration.  CARDIOVASCULAR: S1, S2 normal. No murmurs, rubs, or gallops.  ABDOMEN: Soft, nontender, nondistended. Bowel sounds present. No organomegaly or mass.  EXTREMITIES: No pedal edema, cyanosis, or clubbing.  NEUROLOGIC: Cranial nerves II through XII are intact. Muscle strength 5/5 in all  extremities. Sensation intact. Gait not checked.  PSYCHIATRIC: The patient  is alert and oriented x 3.  SKIN: No obvious rash, lesion, or ulcer.   Data Review     CBC w Diff: Lab Results  Component Value Date   WBC 6.7 11/03/2015   WBC 5.5 08/05/2013   HGB 14.5 11/03/2015   HGB 13.4 08/05/2013   HCT 44.3 11/03/2015   HCT 40.0 08/05/2013   PLT 257 11/03/2015   PLT 323 08/05/2013   LYMPHOPCT 32% 11/01/2015   LYMPHOPCT 32.2 06/18/2013   MONOPCT 4% 11/01/2015   MONOPCT 9.6 06/18/2013   EOSPCT 4% 11/01/2015   EOSPCT 3.9 06/18/2013   BASOPCT 1% 11/01/2015   BASOPCT 2.5 06/18/2013   CMP: Lab Results  Component Value Date   NA 139 11/02/2015   NA 134* 08/05/2013   K 4.5 11/02/2015   K 4.5 08/05/2013   CL 109 11/02/2015   CL 106 08/05/2013   CO2 21* 11/02/2015   CO2 19* 08/05/2013   BUN 17 11/02/2015   BUN 9 08/05/2013   CREATININE 1.01* 11/02/2015   CREATININE 0.88 08/05/2013   PROT 7.3 11/01/2015   PROT 7.9 08/05/2013   ALBUMIN 3.6 11/01/2015   ALBUMIN 3.2* 08/05/2013   BILITOT 0.2* 11/01/2015   BILITOT 0.9 08/05/2013   ALKPHOS 76 11/01/2015   ALKPHOS 103 08/05/2013   AST 31 11/01/2015   AST 26 08/05/2013   ALT 24 11/01/2015   ALT 15 08/05/2013  .  Micro Results Recent Results (from the past 240 hour(s))  Blood culture (routine x 2)     Status: Abnormal (Preliminary result)   Collection Time: 11/01/15 12:02 PM  Result Value Ref Range Status   Specimen Description BLOOD LEFT ARM  Final   Special Requests BOTTLES DRAWN AEROBIC AND ANAEROBIC 7CC  Final   Culture  Setup Time   Final    GRAM POSITIVE COCCI AEROBIC BOTTLE ONLY CRITICAL VALUE NOTED.  VALUE IS CONSISTENT WITH PREVIOUSLY REPORTED AND CALLED VALUE. GRAM POSITIVE RODS RBV HENRY ZOMPA  11/03/15 0855 MLM    Culture (A)  Final    COAGULASE NEGATIVE STAPHYLOCOCCUS GRAM POSITIVE RODS IN BOTH AEROBIC AND ANAEROBIC BOTTLES Results consistent with contamination.    Report Status PENDING  Incomplete   Blood culture (routine x 2)     Status: Abnormal (Preliminary result)   Collection Time: 11/01/15 12:02 PM  Result Value Ref Range Status   Specimen Description BLOOD RIGHT ARM  Final   Special Requests BOTTLES DRAWN AEROBIC AND ANAEROBIC 7CC  Final   Culture  Setup Time   Final    GRAM POSITIVE COCCI ANAEROBIC BOTTLE ONLY CRITICAL RESULT CALLED TO, READ BACK BY AND VERIFIED WITH: CALLED SHEMA HALLEJI ON 11/02/15 AT 0953 BY [facility] GRAM POSITIVE RODS CRITICAL RESULT CALLED TO, READ BACK BY AND VERIFIED WITH: HENRY ZOMPA 11/03/15 0855 MLM    Culture (A)  Final    COAGULASE NEGATIVE STAPHYLOCOCCUS GRAM POSITIVE RODS IN BOTH AEROBIC AND ANAEROBIC BOTTLES Results consistent with contamination.    Report Status PENDING  Incomplete  Blood Culture ID Panel (Reflexed)     Status: Abnormal   Collection Time: 11/01/15 12:02 PM  Result Value Ref Range Status   Enterococcus species NOT DETECTED NOT DETECTED Final   Vancomycin resistance NOT DETECTED NOT DETECTED Final   Listeria monocytogenes NOT DETECTED NOT DETECTED Final   Staphylococcus species DETECTED (A) NOT DETECTED Final    Comment: CRITICAL RESULT CALLED TO, READ BACK BY AND VERIFIED WITH: CALLED SHEMA HALLEJI ON 11/02/15 AT 0953 [facility]  Staphylococcus aureus NOT DETECTED NOT DETECTED Final   Methicillin resistance DETECTED (A) NOT DETECTED Final    Comment: CRITICAL RESULT CALLED TO, READ BACK BY AND VERIFIED WITH: CALLED SHEMA HALLEJI ON 11/02/15 AT 0953 BY [facility]    Streptococcus species NOT DETECTED NOT DETECTED Final   Streptococcus agalactiae NOT DETECTED NOT DETECTED Final   Streptococcus pneumoniae NOT DETECTED NOT DETECTED Final   Streptococcus pyogenes NOT DETECTED NOT DETECTED Final   Acinetobacter baumannii NOT DETECTED NOT DETECTED Final   Enterobacteriaceae species NOT DETECTED NOT DETECTED Final   Enterobacter cloacae complex NOT DETECTED NOT DETECTED Final   Escherichia coli NOT DETECTED NOT DETECTED Final   Klebsiella  oxytoca NOT DETECTED NOT DETECTED Final   Klebsiella pneumoniae NOT DETECTED NOT DETECTED Final   Proteus species NOT DETECTED NOT DETECTED Final   Serratia marcescens NOT DETECTED NOT DETECTED Final   Carbapenem resistance NOT DETECTED NOT DETECTED Final   Haemophilus influenzae NOT DETECTED NOT DETECTED Final   Neisseria meningitidis NOT DETECTED NOT DETECTED Final   Pseudomonas aeruginosa NOT DETECTED NOT DETECTED Final   Candida albicans NOT DETECTED NOT DETECTED Final   Candida glabrata NOT DETECTED NOT DETECTED Final   Candida krusei NOT DETECTED NOT DETECTED Final   Candida parapsilosis NOT DETECTED NOT DETECTED Final   Candida tropicalis NOT DETECTED NOT DETECTED Final        Code Status Orders        Start     Ordered   11/01/15 1450  Full code   Continuous     11/01/15 1452    Code Status History    Date Active Date Inactive Code Status Order ID Comments User Context   10/03/2015 10:52 PM 10/08/2015  2:18 PM Full Code [id number]  Gwendolyn Fill, NP ED          Follow-up Information    Follow up with pcp .   Why:  once d/c from [facility]      Discharge Medications     Medication List    STOP taking these medications        doxepin 10 MG capsule  Commonly known as:  SINEQUAN     tiZANidine 2 MG tablet  Commonly known as:  ZANAFLEX     traZODone 150 MG tablet  Commonly known as:  DESYREL     warfarin 5 MG tablet  Commonly known as:  COUMADIN      TAKE these medications        hydrochlorothiazide 25 MG tablet  Commonly known as:  HYDRODIURIL  Take 25 mg by mouth daily.     lithium carbonate 300 MG capsule  Take 300 mg by mouth daily.     ondansetron 4 MG tablet  Commonly known as:  ZOFRAN  Take 1 tablet (4 mg total) by mouth every 8 (eight) hours as needed for nausea or vomiting.     Rivaroxaban 15 MG Tabs tablet  Commonly known as:  XARELTO  Take 1 tablet (15 mg total) by mouth 2 (two) times daily with a meal.     rivaroxaban 20 MG Tabs  tablet  Commonly known as:  XARELTO  Take 1 tablet (20 mg total) by mouth daily.  Start taking on:  11/23/2015           Total Time in preparing paper work, data evaluation and todays exam - 35 minutes  Courtney Hebert M.D on 11/03/2015 at 12:42 PM  [facility]   Office  [phone]

## 2015-11-03 NOTE — Progress Notes (Signed)
Husband just called wanting an update on the patient. No password has been established. I informed husband that i would ask patient if it is ok to give information to him. Husband became irrate and angry, stating he is the one that out her in here, and that he needs information or else. I again told him about having a password established, or i would have to ask the patient, he was still angry and yelling on the phone. I asked if he would like to speak with my director to obtain more information on HIPPA and our policies, he states yes let me speak to her. The director was in a meeting, so i asked him to let her call him back. He became more angry and hung up. I spoke with patient, and notified that there was no password. Patient established a password and was on the phone with her husband at the time and gave it to him. Patient states that her husband gets angry easily. Husband states on the phone that he is coming up here to talk to someone. Will continue to monitor.

## 2015-11-03 NOTE — Progress Notes (Signed)
*  PRELIMINARY RESULTS* Echocardiogram 2D Echocardiogram has been performed.  Courtney HousekeeperJerry R Hebert 11/03/2015, 9:54 AM

## 2015-11-03 NOTE — Consult Note (Signed)
[facility] Face-to-Face Psychiatry Consult   Reason for Consult:  Consult for this 40 year old woman in the hospital on involuntary commitment also in the ICU. Brought in unconscious with what was initially presumed to be opiate overdose. Subsequently it was discovered that she had a pulmonary embolism. Referring Physician:  Bridgett Larsson Patient Identification: Courtney Hebert MRN:  [id number] Principal Diagnosis: Bipolar affective disorder, currently depressed, moderate ([place]) Diagnosis:   Patient Active Problem List   Diagnosis Date Noted  . Opiate overdose [T40.601A] 11/02/2015  . Suicidal ideation [R45.851] 11/02/2015  . Involuntary commitment [Z04.6] 11/02/2015  . Benzodiazepine abuse [F13.10] 11/02/2015  . Pulmonary embolism ([place]) [I26.99] 11/01/2015  . Bipolar affective disorder, currently depressed, moderate ([place]) [F31.32]   . Bipolar disorder ([place]) [F31.9] 10/06/2015  . Opiate dependence ([place]) [F11.20] 10/06/2015  . Methadone overdose [T40.3X4A] 10/03/2015  . Polysubstance abuse [F19.10] 10/03/2015  . Acute hypoxemic respiratory failure ([place]) [J96.01] 10/03/2015  . Tobacco abuse [Z72.0] 10/03/2015  . Migraine [G43.909] 10/03/2015    Total Time spent with patient: 30 minutes  Subjective:   Courtney Hebert is a 40 y.o. female patient admitted with "I'm leaving here and I don't care if I die".  Follow-up as of Monday the fifth for this 40 year old woman. Patient has moved out of the critical care unit. Medically he appears more stable. I saw her this afternoon and the patient continues to feel emotionally distressed. Depressed and hopeless and anxious. Behavior however is much improved. She is not acting out in an angry way like she was yesterday. Patient still feels somewhat hopeless about her situation and focuses on somatic complaints. We had planned to have her transferred to the psychiatry ward today but had spaces not yet available.  HPI:  Patient interviewed. Chart reviewed. Case discussed  with CCU physician and nursing staff. This is a 40 year old woman known to me from previous encounters. Brought into the hospital this time after once again being found unconscious by her family. Initial presumption was opiate overdose based on previous encounters however during her treatment it was discovered that she also had a pulmonary embolism. Since waking up and being extubated the patient has been agitated and is trying to leave the hospital even though commitment papers were filed. On interview the patient states that she wants to leave the hospital immediately. Her reason for wanting to leave is the severe pain in her legs. Patient states that she does not care whether she dies. She makes comments about putting bullets in her head to treat her pain. She tells me that if she leaves here she will "take care of myself" by going home and taking all the drugs she can whether it kills her or not. She is quite agitated about all of this and doesn't really give a very clear history. She is able to tell me that after her last discharge she went back to the methadone clinic and was started back on Suboxone rather than methadone. She believes that she had an allergy to Suboxone and that it was making her throw up for 2 weeks. She says at the end she could not stand it anymore and so she relapsed into drug use. She has once again been shooting up synthetic narcotics and has been taking illegal Xanax.  Social history: Patient lives with her husband. Husband is aware of her substance abuse problem and her medical problems. Patient is not currently working outside the home.  Medical history: Overweight. History of previous DVTs. History of chronic back pain.  Bipolar disorder chronic migraines  Substance abuse history: Long history of abuse of multiple substances with narcotics being the main culprit. History of intravenous use of heroin. More recently had relapsed into intravenous use of synthetic narcotics that  she buys online. Also abuses benzodiazepines.  Past Psychiatric History: Patient has a history of bipolar disorder. Had been on lithium in the past with good results and I tried restarting her on lithium last time she was in the hospital. Does have a history of hospitalizations also a prior history of suicide attempts.  Risk to Self: Is patient at risk for suicide?: No Risk to Others:   Prior Inpatient Therapy:   Prior Outpatient Therapy:    Past Medical History:  Past Medical History  Diagnosis Date  . Chronic back pain   . Vascular disease   . DVT (deep venous thrombosis) ([place])   . Bipolar 1 disorder ([place])    History reviewed. No pertinent past surgical history. Family History: History reviewed. No pertinent family history. Family Psychiatric  History: Family history positive for mood instability Social History:  History  Alcohol Use  . Yes     History  Drug Use  . Yes  . Special: IV    Comment: heroin    Social History   Social History  . Marital Status: Married    Spouse Name: N/A  . Number of Children: N/A  . Years of Education: N/A   Social History Main Topics  . Smoking status: Current Every Day Smoker -- 1.00 packs/day    Types: Cigarettes  . Smokeless tobacco: None  . Alcohol Use: Yes  . Drug Use: Yes    Special: IV     Comment: heroin  . Sexual Activity: Not Asked   Other Topics Concern  . None   Social History Narrative   Additional Social History:    Allergies:   Allergies  Allergen Reactions  . Vancomycin Anaphylaxis    Labs:  Results for orders placed or performed during the hospital encounter of 11/01/15 (from the past 48 hour(s))  Glucose, capillary     Status: None   Collection Time: 11/01/15  7:41 PM  Result Value Ref Range   Glucose-Capillary 80 65 - 99 mg/dL   Comment 1 Notify RN   Heparin level (unfractionated)     Status: None   Collection Time: 11/01/15  9:51 PM  Result Value Ref Range   Heparin Unfractionated 0.61 0.30 -  0.70 IU/mL    Comment:        IF HEPARIN RESULTS ARE BELOW EXPECTED VALUES, AND PATIENT DOSAGE HAS BEEN CONFIRMED, SUGGEST FOLLOW UP TESTING OF ANTITHROMBIN III LEVELS.   Triglycerides     Status: Abnormal   Collection Time: 11/01/15  9:51 PM  Result Value Ref Range   Triglycerides 244 (H) <150 mg/dL  Glucose, capillary     Status: None   Collection Time: 11/01/15 11:24 PM  Result Value Ref Range   Glucose-Capillary 86 65 - 99 mg/dL   Comment 1 Notify RN   Glucose, capillary     Status: None   Collection Time: 11/02/15  3:32 AM  Result Value Ref Range   Glucose-Capillary 83 65 - 99 mg/dL   Comment 1 Notify RN   Blood gas, arterial     Status: Abnormal   Collection Time: 11/02/15  5:00 AM  Result Value Ref Range   FIO2 0.40    Delivery systems VENTILATOR    Mode PRESSURE REGULATED VOLUME CONTROL  VT 500 mL   Peep/cpap 5.0 cm H20   pH, Arterial 7.36 7.350 - 7.450   pCO2 arterial 44 32.0 - 48.0 mmHg   pO2, Arterial 148 (H) 83.0 - 108.0 mmHg   Bicarbonate 24.9 21.0 - 28.0 mEq/L   Acid-base deficit 0.8 0.0 - 2.0 mmol/L   O2 Saturation 99.2 %   Patient temperature 37.0    Collection site RIGHT RADIAL    Sample type ARTERIAL DRAW    Allens test (pass/fail) PASS PASS   Mechanical Rate 12   CBC     Status: Abnormal   Collection Time: 11/02/15  5:07 AM  Result Value Ref Range   WBC 11.4 (H) 3.6 - 11.0 K/uL   RBC 4.95 3.80 - 5.20 MIL/uL   Hemoglobin 14.0 12.0 - 16.0 g/dL   HCT 43.3 35.0 - 47.0 %   MCV 87.3 80.0 - 100.0 fL   MCH 28.3 26.0 - 34.0 pg   MCHC 32.4 32.0 - 36.0 g/dL   RDW 16.9 (H) 11.5 - 14.5 %   Platelets 231 150 - 440 K/uL  Basic metabolic panel     Status: Abnormal   Collection Time: 11/02/15  5:07 AM  Result Value Ref Range   Sodium 139 135 - 145 mmol/L   Potassium 4.5 3.5 - 5.1 mmol/L   Chloride 109 101 - 111 mmol/L   CO2 21 (L) 22 - 32 mmol/L   Glucose, Bld 94 65 - 99 mg/dL   BUN 17 6 - 20 mg/dL   Creatinine, Ser 1.01 (H) 0.44 - 1.00 mg/dL    Calcium 8.3 (L) 8.9 - 10.3 mg/dL   GFR calc non Af Amer >60 >60 mL/min   GFR calc Af Amer >60 >60 mL/min    Comment: (NOTE) The eGFR has been calculated using the CKD EPI equation. This calculation has not been validated in all clinical situations. eGFR's persistently <60 mL/min signify possible Chronic Kidney Disease.    Anion gap 9 5 - 15  Magnesium     Status: None   Collection Time: 11/02/15  5:07 AM  Result Value Ref Range   Magnesium 2.1 1.7 - 2.4 mg/dL  Phosphorus     Status: None   Collection Time: 11/02/15  5:07 AM  Result Value Ref Range   Phosphorus 3.7 2.5 - 4.6 mg/dL  Glucose, capillary     Status: None   Collection Time: 11/02/15  7:04 AM  Result Value Ref Range   Glucose-Capillary 97 65 - 99 mg/dL  CBC     Status: Abnormal   Collection Time: 11/02/15  8:24 AM  Result Value Ref Range   WBC 10.1 3.6 - 11.0 K/uL   RBC 4.81 3.80 - 5.20 MIL/uL   Hemoglobin 13.8 12.0 - 16.0 g/dL   HCT 42.4 35.0 - 47.0 %   MCV 88.1 80.0 - 100.0 fL   MCH 28.8 26.0 - 34.0 pg   MCHC 32.7 32.0 - 36.0 g/dL   RDW 17.0 (H) 11.5 - 14.5 %   Platelets 250 150 - 440 K/uL  Heparin level (unfractionated)     Status: None   Collection Time: 11/02/15  8:24 AM  Result Value Ref Range   Heparin Unfractionated 0.38 0.30 - 0.70 IU/mL    Comment:        IF HEPARIN RESULTS ARE BELOW EXPECTED VALUES, AND PATIENT DOSAGE HAS BEEN CONFIRMED, SUGGEST FOLLOW UP TESTING OF ANTITHROMBIN III LEVELS.   Glucose, capillary     Status: None  Collection Time: 11/02/15 11:46 AM  Result Value Ref Range   Glucose-Capillary 95 65 - 99 mg/dL  Magnesium     Status: None   Collection Time: 11/02/15  5:03 PM  Result Value Ref Range   Magnesium 1.9 1.7 - 2.4 mg/dL  Phosphorus     Status: None   Collection Time: 11/02/15  5:03 PM  Result Value Ref Range   Phosphorus 3.6 2.5 - 4.6 mg/dL  Glucose, capillary     Status: None   Collection Time: 11/02/15  7:43 PM  Result Value Ref Range   Glucose-Capillary 80 65 -  99 mg/dL   Comment 1 Notify RN   Magnesium     Status: None   Collection Time: 11/03/15  4:55 AM  Result Value Ref Range   Magnesium 2.0 1.7 - 2.4 mg/dL  Phosphorus     Status: None   Collection Time: 11/03/15  4:55 AM  Result Value Ref Range   Phosphorus 4.5 2.5 - 4.6 mg/dL  CBC     Status: Abnormal   Collection Time: 11/03/15  4:55 AM  Result Value Ref Range   WBC 6.7 3.6 - 11.0 K/uL   RBC 5.09 3.80 - 5.20 MIL/uL   Hemoglobin 14.5 12.0 - 16.0 g/dL   HCT 44.3 35.0 - 47.0 %   MCV 86.9 80.0 - 100.0 fL   MCH 28.5 26.0 - 34.0 pg   MCHC 32.8 32.0 - 36.0 g/dL   RDW 16.9 (H) 11.5 - 14.5 %   Platelets 257 150 - 440 K/uL    Current Facility-Administered Medications  Medication Dose Route Frequency Provider Last Rate Last Dose  . 0.9 %  sodium chloride infusion  250 mL Intravenous PRN Laverle Hobby, MD      . acetaminophen (TYLENOL) tablet 650 mg  650 mg Oral Q4H PRN Laverle Hobby, MD   650 mg at 11/02/15 2108  . antiseptic oral rinse solution (CORINZ)  7 mL Mouth Rinse 10 times per day Laverle Hobby, MD   7 mL at 11/02/15 2109  . chlorhexidine gluconate (SAGE KIT) (PERIDEX) 0.12 % solution 15 mL  15 mL Mouth Rinse BID Laverle Hobby, MD   15 mL at 11/02/15 0800  . feeding supplement (ENSURE ENLIVE) (ENSURE ENLIVE) liquid 237 mL  237 mL Oral TID Mikael Spray, NP   237 mL at 11/03/15 1726  . methadone (DOLOPHINE) tablet 20 mg  20 mg Oral Q12H Gonzella Lex, MD   20 mg at 11/03/15 0844  . nicotine (NICODERM CQ - dosed in mg/24 hours) patch 21 mg  21 mg Transdermal Daily Gonzella Lex, MD   21 mg at 11/03/15 0844  . ondansetron (ZOFRAN) injection 4 mg  4 mg Intravenous Q6H PRN Laverle Hobby, MD      . pantoprazole sodium (PROTONIX) 40 mg/20 mL oral suspension 40 mg  40 mg Per Tube Daily Laverle Hobby, MD   40 mg at 11/03/15 0844  . polyethylene glycol (MIRALAX / GLYCOLAX) packet 17 g  17 g Oral Daily Mikael Spray, NP   17 g at 11/03/15  0843  . Rivaroxaban (XARELTO) tablet 15 mg  15 mg Oral BID WC Sheema M Hallaji, RPH   15 mg at 11/03/15 1726  . [START ON 11/23/2015] rivaroxaban (XARELTO) tablet 20 mg  20 mg Oral Daily Sheema M Hallaji, RPH      . senna (SENOKOT) tablet 17.2 mg  2 tablet Oral Daily PRN Mikael Spray, NP  Musculoskeletal: Strength & Muscle Tone: decreased Gait & Station: unable to stand Patient leans: Backward  Psychiatric Specialty Exam: Physical Exam  Constitutional: She appears well-developed and well-nourished. She is uncooperative. She is easily aroused. She has a sickly appearance. She appears distressed.  HENT:  Head: Normocephalic and atraumatic.  Eyes: Conjunctivae are normal. Pupils are equal, round, and reactive to light.  Neck: Normal range of motion.  Cardiovascular: Normal heart sounds.   Respiratory: Effort normal.  GI: Soft.  Musculoskeletal: Normal range of motion.  Neurological: She is alert and easily aroused.  Skin: Skin is warm and dry.  Psychiatric: Her behavior is normal. Her mood appears anxious. Her affect is not angry and not labile. Her speech is slurred. Her speech is not rapid and/or pressured. She is not agitated and not aggressive. Thought content is not paranoid. She expresses impulsivity. She exhibits a depressed mood. She expresses suicidal ideation. She is communicative. She exhibits abnormal recent memory and abnormal remote memory.    Review of Systems  Constitutional: Negative.   HENT: Negative.   Eyes: Negative.   Respiratory: Negative.   Cardiovascular: Negative.   Gastrointestinal: Positive for nausea and abdominal pain.  Musculoskeletal: Negative.        Patient is complaining of severe pain in both of her legs particularly in her left upper leg.  Skin: Negative.   Neurological: Negative.   Psychiatric/Behavioral: Positive for depression, suicidal ideas, memory loss and substance abuse. Negative for hallucinations. The patient is  nervous/anxious and has insomnia.     Blood pressure 148/88, pulse 76, temperature 98.4 F (36.9 C), temperature source Oral, resp. rate 20, height 5' 7"  (1.702 m), weight 136.623 kg (301 lb 3.2 oz), SpO2 94 %.Body mass index is 47.16 kg/(m^2).  General Appearance: Disheveled  Eye Contact:  Minimal  Speech:  Garbled and Pressured  Volume:  Increased  Mood:  Anxious, Depressed and Irritable  Affect:  Inappropriate and Labile  Thought Process:  Disorganized  Orientation:  Full (Time, Place, and Person)  Thought Content:  Illogical, Paranoid Ideation and Rumination  Suicidal Thoughts:  Yes.  with intent/plan  Homicidal Thoughts:  No  Memory:  Immediate;   Fair Recent;   Poor Remote;   Poor  Judgement:  Impaired  Insight:  Shallow  Psychomotor Activity:  Restlessness  Concentration:  Concentration: Poor  Recall:  Poor  Fund of Knowledge:  Poor  Language:  Poor  Akathisia:  No  Handed:  Right  AIMS (if indicated):     Assets:  Financial Resources/Insurance Housing Social Support  ADL's:  Impaired  Cognition:  Impaired,  Mild  Sleep:        Treatment Plan Summary: Daily contact with patient to assess and evaluate symptoms and progress in treatment, Medication management and Plan She is finishing up the brief amount of methadone I could give her. Physically appears more stable. She seems to not remember the interaction we had yesterday. She repeats a lot of information that she told me before. Affect is certainly much calmer and she is not as hostile. Family dynamics continued to be chaotic it appears. I still think the best thing would be transferred to psychiatry for stabilization. Orders still standing for tomorrow.  Disposition: Supportive therapy provided about ongoing stressors. Discussed crisis plan, support from social network, calling 911, coming to the Emergency Department, and calling Suicide Hotline.  Alethia Berthold, MD 11/03/2015 6:33 PM

## 2015-11-03 NOTE — Progress Notes (Signed)
Husband just left, after getting into argument with patient(wife).

## 2015-11-03 NOTE — Progress Notes (Signed)
While setting up a password with patient, patient states she does not want her husband knowing that she is recieving methadone here in the hospital. Patient states her husband thinks methadone is evil.

## 2015-11-03 NOTE — Progress Notes (Signed)
Husband at bedside now. Wanting to speak to the Dr. Dr Toni Amendlapacs paged, states he will round on patient later and that he has already spoke with patient on phone.

## 2015-11-03 NOTE — Progress Notes (Signed)
Pt arrived to unit from ICU. Upon arrival to room demanding pain medication and methadone.Pain assessed and pt denies pain, states that "I need more methadone because what they gave me is not fucking working which was hours ago"Pt states that she will leave. Refuses to let nursing staff place cardiac monitor. Education given without relief. Prime doc paged awaiting response.

## 2015-11-03 NOTE — Progress Notes (Signed)
Patient requesting foley to be removed. Dr Allena KatzPatel paged. Orders placed to remove foley.

## 2015-11-03 NOTE — Progress Notes (Signed)
Patient keeps asking for ativan. Patient states she doesn't want to go downstairs and that she wants to go home. Dr Toni Amendlapacs paged. Waiting on callback.

## 2015-11-03 NOTE — Care Management Note (Addendum)
Case Management Note  Patient Details  Name: Courtney Hebert MRN: [id number] Date of Birth: [birth year]  Subjective/Objective:        40yo [name] was admitted 11/01/15 after intubation in the ED. Dx: multiple pulmonary emboli. Extubated 11/02/15. Resides at home with her husband. Seen several times during this hospitalization by Dr Toni Amendlapacs. Hx; Bipolar DO, Chronic back pain, migraines, smokes 1ppd, polysubstance abuse. Was going to an outpatient clinic and receiving Suboxone which Dr Toni Amendlapacs has changed to Methadone during this hospitalization per patient reports that Suboxone causes nausea. Has been somewhat emotionally labile with staff which is improving. Case management will follow for discharge planning but does not anticipate any home health services needs. Tolerating room air well thus far.          Action/Plan:   Expected Discharge Date:                  Expected Discharge Plan:     In-House Referral:     Discharge planning Services     Post Acute Care Choice:    Choice offered to:     DME Arranged:    DME Agency:     HH Arranged:    HH Agency:     Status of Service:     Medicare Important Message Given:    Date Medicare IM Given:    Medicare IM give by:    Date Additional Medicare IM Given:    Additional Medicare Important Message give by:     If discussed at Long Length of Stay Meetings, dates discussed:    Additional Comments:  Shmiel Morton A, RN 11/03/2015, 8:04 AM

## 2015-11-04 ENCOUNTER — Inpatient Hospital Stay
Admission: RE | Admit: 2015-11-04 | Discharge: 2015-11-04 | DRG: 885 | Disposition: A | Discharge status: Home / Self Care | Payer: Self-pay | Source: Intra-hospital | Attending: Psychiatry | Admitting: Psychiatry

## 2015-11-04 DIAGNOSIS — T402X1A Poisoning by other opioids, accidental (unintentional), initial encounter: Secondary | ICD-10-CM | POA: Diagnosis present

## 2015-11-04 DIAGNOSIS — F314 Bipolar disorder, current episode depressed, severe, without psychotic features: Principal | ICD-10-CM | POA: Diagnosis present

## 2015-11-04 DIAGNOSIS — F1320 Sedative, hypnotic or anxiolytic dependence, uncomplicated: Secondary | ICD-10-CM | POA: Diagnosis present

## 2015-11-04 DIAGNOSIS — F17200 Nicotine dependence, unspecified, uncomplicated: Secondary | ICD-10-CM | POA: Diagnosis present

## 2015-11-04 DIAGNOSIS — Z91410 Personal history of adult physical and sexual abuse: Secondary | ICD-10-CM

## 2015-11-04 DIAGNOSIS — Z046 Encounter for general psychiatric examination, requested by authority: Secondary | ICD-10-CM

## 2015-11-04 DIAGNOSIS — I2699 Other pulmonary embolism without acute cor pulmonale: Secondary | ICD-10-CM | POA: Diagnosis present

## 2015-11-04 DIAGNOSIS — Z62819 Personal history of unspecified abuse in childhood: Secondary | ICD-10-CM | POA: Diagnosis present

## 2015-11-04 DIAGNOSIS — Z86711 Personal history of pulmonary embolism: Secondary | ICD-10-CM

## 2015-11-04 DIAGNOSIS — F17210 Nicotine dependence, cigarettes, uncomplicated: Secondary | ICD-10-CM | POA: Diagnosis present

## 2015-11-04 DIAGNOSIS — F1120 Opioid dependence, uncomplicated: Secondary | ICD-10-CM | POA: Diagnosis present

## 2015-11-04 DIAGNOSIS — G8929 Other chronic pain: Secondary | ICD-10-CM | POA: Diagnosis present

## 2015-11-04 DIAGNOSIS — G4730 Sleep apnea, unspecified: Secondary | ICD-10-CM | POA: Diagnosis present

## 2015-11-04 DIAGNOSIS — Z9114 Patient's other noncompliance with medication regimen: Secondary | ICD-10-CM

## 2015-11-04 DIAGNOSIS — R45851 Suicidal ideations: Secondary | ICD-10-CM | POA: Diagnosis present

## 2015-11-04 DIAGNOSIS — M549 Dorsalgia, unspecified: Secondary | ICD-10-CM | POA: Diagnosis present

## 2015-11-04 DIAGNOSIS — F1121 Opioid dependence, in remission: Secondary | ICD-10-CM | POA: Diagnosis present

## 2015-11-04 DIAGNOSIS — F4310 Post-traumatic stress disorder, unspecified: Secondary | ICD-10-CM | POA: Diagnosis present

## 2015-11-04 DIAGNOSIS — Z888 Allergy status to other drugs, medicaments and biological substances status: Secondary | ICD-10-CM

## 2015-11-04 HISTORY — DX: Sleep apnea, unspecified: G47.30

## 2015-11-04 HISTORY — DX: Anxiety disorder, unspecified: F41.9

## 2015-11-04 HISTORY — DX: Unspecified convulsions: R56.9

## 2015-11-04 LAB — CULTURE, BLOOD (ROUTINE X 2)

## 2015-11-04 MED ORDER — METHADONE HCL 10 MG PO TABS: 20.0000 mg | ORAL_TABLET | Freq: Two times a day (BID) | ORAL | Status: DC

## 2015-11-04 MED ORDER — RIVAROXABAN 20 MG PO TABS: 20.0000 mg | ORAL_TABLET | Freq: Every day | ORAL | Status: DC

## 2015-11-04 MED ORDER — RIVAROXABAN 15 MG PO TABS: 15.0000 mg | ORAL_TABLET | Freq: Two times a day (BID) | ORAL | Status: DC

## 2015-11-04 MED ORDER — ALUM & MAG HYDROXIDE-SIMETH 200-200-20 MG/5ML PO SUSP: 30.0000 mL | ORAL | Status: DC | PRN

## 2015-11-04 MED ORDER — LITHIUM CARBONATE 300 MG PO CAPS: 300.0000 mg | ORAL_CAPSULE | Freq: Every day | ORAL | Status: AC

## 2015-11-04 MED ORDER — LAMOTRIGINE 25 MG PO TABS: 25.0000 mg | ORAL_TABLET | Freq: Every day | ORAL | Status: DC

## 2015-11-04 MED ORDER — LITHIUM CARBONATE 300 MG PO CAPS: 300.0000 mg | ORAL_CAPSULE | Freq: Every day | ORAL | Status: DC

## 2015-11-04 MED ORDER — METHADONE HCL 10 MG PO TABS
20.0000 mg | ORAL_TABLET | Freq: Once | ORAL | Status: AC
  Administered 2015-11-04: 20 mg via ORAL
  Filled 2015-11-04: qty 2

## 2015-11-04 MED ORDER — RIVAROXABAN 20 MG PO TABS: 20.0000 mg | ORAL_TABLET | Freq: Every day | ORAL | Status: AC

## 2015-11-04 MED ORDER — METHADONE HCL 10 MG PO TABS
20.0000 mg | ORAL_TABLET | Freq: Every day | ORAL | Status: DC
  Administered 2015-11-04: 20 mg via ORAL
  Filled 2015-11-04: qty 2

## 2015-11-04 MED ORDER — HYDROCHLOROTHIAZIDE 25 MG PO TABS: 25.0000 mg | ORAL_TABLET | Freq: Every day | ORAL | Status: DC

## 2015-11-04 MED ORDER — RIVAROXABAN 15 MG PO TABS
15.0000 mg | ORAL_TABLET | Freq: Two times a day (BID) | ORAL | Status: DC
  Administered 2015-11-04: 15 mg via ORAL
  Filled 2015-11-04: qty 1

## 2015-11-04 MED ORDER — MAGNESIUM HYDROXIDE 400 MG/5ML PO SUSP: 30.0000 mL | Freq: Every day | ORAL | Status: DC | PRN

## 2015-11-04 MED ORDER — NICOTINE 21 MG/24HR TD PT24
21.0000 mg | MEDICATED_PATCH | Freq: Every day | TRANSDERMAL | Status: DC
  Administered 2015-11-04: 21 mg via TRANSDERMAL

## 2015-11-04 MED ORDER — ACETAMINOPHEN 325 MG PO TABS: 650.0000 mg | ORAL_TABLET | Freq: Four times a day (QID) | ORAL | Status: DC | PRN

## 2015-11-04 NOTE — Progress Notes (Signed)
Patient discharged home. DC instructions provided and explained. Medications reviewed. Rx given. All questions answered. Pt stable at discharge. Denies SI, HI, AVH. 7 day supply given, belongings returned.

## 2015-11-04 NOTE — Tx Team (Signed)
Initial Interdisciplinary Treatment Plan   PATIENT STRESSORS: Financial difficulties Marital or family conflict   PATIENT STRENGTHS: Capable of independent living Communication skills   PROBLEM LIST: Problem List/Patient Goals Date to be addressed Date deferred Reason deferred Estimated date of resolution  depression 11/04/2015     Substance abuse 11/04/2015                                                 DISCHARGE CRITERIA:  Improved stabilization in mood, thinking, and/or behavior  PRELIMINARY DISCHARGE PLAN: Return to previous living arrangement  PATIENT/FAMIILY INVOLVEMENT: This treatment plan has been presented to and reviewed with the patient, Courtney Hebert, and/or family member.  The patient and family have been given the opportunity to ask questions and make suggestions.  Courtney Hebert 11/04/2015, 5:14 AM

## 2015-11-04 NOTE — Progress Notes (Signed)
Spoke with [employer]Matt from pharmacy. Pharmatist is requesting clarification on methadone and Xarelto . States he will verify all other po medications and PRNs. MD made aware.

## 2015-11-04 NOTE — Progress Notes (Signed)
Patient was transferred down to behavior health.

## 2015-11-04 NOTE — Progress Notes (Signed)
Pt returned and signed for stored medication.

## 2015-11-04 NOTE — Progress Notes (Signed)
Patient 40 year old white female transferred from medical unit dt OD on heroine and fenatyl patch. Pt found unresponsive by her husband.  H/O Pulmonary embolism, MRSA, bipolar d/o, Chronic back pain and substance abuse. Pt states she OD on methadone and xanax mixture dt multiple stressors at home. Pt  focuses on pain medications regime.  States she takes methadone 150 mg po daily but methodone 20 mg twice daily is scheduled while in the hospital. Pt is anxious and tearful. States she can't understand why she's here. Pt said "the doctor told me I'll only be here one day". Clinical support provided. Pt encouraged to express her feelings and concerns.  Feedback provided to Dr Jennet MaduroPucilowska. Patient searched for contraband none found. Skin checked with another nurse. Skin intact, warm and dry to touch. Multiple scars to both arms, legs, hands and feet caused by track marks and MRSA. Denies SI/HI/AVH. Will continue to monitor for safety and behavior.

## 2015-11-04 NOTE — [facility] Suicide Risk Assessment (Signed)
[facility] INPATIENT:  Family/Significant Other Suicide Prevention Education  Suicide Prevention Education:  Education Completed; Husband, Magdalen SpatzKeith Seim,  (name of family member/significant other) has been identified by the patient as the family member/significant other with whom the patient will be residing, and identified as the person(s) who will aid the patient in the event of a mental health crisis (suicidal ideations/suicide attempt).  With written consent from the patient, the family member/significant other has been provided the following suicide prevention education, prior to the and/or following the discharge of the patient.  The suicide prevention education provided includes the following:  Suicide risk factors  Suicide prevention and interventions  National Suicide Hotline telephone number  [facility] assessment telephone number  [facility] 911  [facility] and/or [facility]idential [facility] Crisis [facility] telephone number  Request made of family/significant other to:  Remove weapons (e.g., guns, rifles, knives), all items previously/currently identified as safety concern.    Remove drugs/medications (over-the-counter, prescriptions, illicit drugs), all items previously/currently identified as a safety concern.  The family member/significant other verbalizes understanding of the suicide prevention education information provided.  The family member/significant other agrees to remove the items of safety concern listed above.  Glennon MacLaws, Domini Vandehei P, MSW, LCSW 11/04/2015, 4:59 PM
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
## 2015-11-04 NOTE — [facility] Suicide Risk Assessment (Signed)
[facility]   Nursing information obtained from:    Demographic factors:    Current Mental Status:    Loss Factors:    Historical Factors:    Risk Reduction Factors:     Total Time spent with patient: 1 hour Principal Problem: Bipolar I disorder, most recent episode depressed, severe without psychotic features (HCC) Diagnosis:   Patient Active Problem List   Diagnosis Date Noted  . Bipolar I disorder, most recent episode depressed, severe without psychotic features (HCC) [F31.4] 11/04/2015  . Opioid overdose [T40.2X1A] 11/04/2015  . Suicidal ideation [R45.851] 11/02/2015  . Involuntary commitment [Z04.6] 11/02/2015  . 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 Pulmonary embolism (HCC) [I26.99] 11/01/2015  . Opioid use disorder, severe, in early remission, on maintenance therapy [F11.90] 10/03/2015  . Tobacco use disorder [F17.200] 10/03/2015  . Migraine [G43.909] 10/03/2015   Subjective Data: Depression, mood instability, suicidal ideation, substance abuse.  Continued Clinical Symptoms:  Alcohol Use Disorder Identification Test Final Score (AUDIT): 0 The "Alcohol Use Disorders Identification Test", Guidelines for Use in Primary Care, Second Edition.  World Science writerHealth Organization [facility](WHO). Score between 0-7:  no or low risk or alcohol related problems. Score between 8-15:  moderate risk of alcohol related problems. Score between 16-19:  high risk of alcohol related problems. Score 20 or above:  warrants further diagnostic evaluation for alcohol dependence and treatment.   CLINICAL FACTORS:   Bipolar Disorder:   Depressive phase Alcohol/Substance Abuse/Dependencies   Musculoskeletal: Strength & Muscle Tone: within normal limits Gait & Station: normal Patient leans: N/A  Psychiatric Specialty Exam: Physical Exam  Nursing note and vitals reviewed.   Review of Systems  Psychiatric/Behavioral:  Positive for suicidal ideas and substance abuse.  All other systems reviewed and are negative.   Blood pressure 141/75, pulse 80, temperature 98.2 F (36.8 C), temperature source Oral, resp. rate 20, height 5\' 7"  (1.702 m), weight 136.079 kg (300 lb), last menstrual period 11/04/2015, SpO2 99 %.Body mass index is 46.98 kg/(m^2).  General Appearance: Casual  Eye Contact:  Good  Speech:  Clear and Coherent  Volume:  Normal  Mood:  Depressed  Affect:  Appropriate  Thought Process:  Goal Directed  Orientation:  Full (Time, Place, and Person)  Thought Content:  WDL  Suicidal Thoughts:  No  Homicidal Thoughts:  No  Memory:  Immediate;   Fair Recent;   Fair Remote;   Fair  Judgement:  Fair  Insight:  Fair  Psychomotor Activity:  Normal  Concentration:  Concentration: Fair and Attention Span: Fair  Recall:  [employer]Fair  Fund of Knowledge:  Fair  Language:  Fair  Akathisia:  No  Handed:  Right  AIMS (if indicated):     Assets:  Communication Skills Desire for Improvement Housing Intimacy Physical Health Resilience Social Support  ADL's:  Intact  Cognition:  WNL  Sleep:  Number of Hours: 0.5      COGNITIVE FEATURES THAT CONTRIBUTE TO RISK:  None    SUICIDE RISK:   Minimal: No identifiable suicidal ideation.  Patients presenting with no risk factors but with morbid ruminations; may be classified as minimal risk based on the severity of the depressive symptoms  PLAN OF CARE: Hospital admission, medication management, substance abuse counseling, discharge planning.  Courtney Hebert is a 40 year old female with history of bipolar disorder and opiate addiction admitted for worsening of depression and suicidal ideation in the context of relapse on substances.  1. Suicidal ideation.  The patient denies any thoughts intentions or plans to hurt herself or others. She is forward thinking and optimistic about the future. She is a loving mother and wife.  2. Mood. The patient has been maintained  on low-dose lithium. She wants to continue. She did well in the past on a combination of lithium and Lamictal but has not been able to afford it for the past year since she lost her insurance. We will start Lamictal and referred her to premedication treatment clinic.  3. Smoking. Nicotine patch is available.  4. Opiate dependence. She was restarted on methadone on the medical floor. We'll continue methadone 20 mg twice daily.  5. Substance abuse treatment. The patient reports that she is in good standing at the methadone clinic and will continue that. She wants to try outpatient treatment program and AA and NA following discharge.  6. Pulmonary embolism. She was started on Xarelto on medical floor.   7. Disposition. She will be discharged with family. She will follow up with [facility] for medication management and substance abuse treatment.   I certify that inpatient services furnished can reasonably be expected to improve the patient's condition.   Kristine Linea, MD 11/04/2015, 10:54 AM
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
## 2015-11-04 NOTE — Progress Notes (Signed)
  [facility] :  Will you be returning to the same living situation after discharge:  Yes,    At discharge, do you have transportation home?: Yes,    Do you have the ability to pay for your medications: Yes,     Release of information consent forms completed and in the chart;  Patient's signature needed at discharge.  Patient to Follow up at: Follow-up Information    [facility] Go on 11/05/2015.   Why:  5:00am-Walk in to discuss status with counselor.  They were not open when SW called prior to discharge and would not give appointment time/date   Contact information:   [address] [place], [place] [address] [phone]      Go to [facility].   Why:  Walk-ins Monday, Wednesday, Friday between 8am-3pm, Please evaluate for SAIOP program at intake, Hospital Follow up, Outpatient Medication Management, Therapy   Contact information:   [address] [place] [address] [phone] Fax      Next level of care provider has [facility] to [facility] Link:no  Safety Planning and Suicide Prevention discussed: Yes,        Has patient been referred to the Quitline?: Patient refused referral  Patient has been referred for addiction treatment: Yes  Bari Leib, Cleda DaubSara P, MSW, LCSW 11/04/2015, 5:00 PM
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
## 2015-11-05 NOTE — [facility] Group Notes (Signed)
[facility] LCSW Group Therapy   11/04/2015 9:30am  Type of Therapy: Group Therapy   Participation Level: Active   Participation Quality: Attentive, Sharing and Supportive   Affect: Appropriate  Cognitive: Alert and Oriented   Insight: Developing/Improving and Engaged   Engagement in Therapy: Developing/Improving and Engaged   Modes of Intervention: Clarification, Confrontation, Discussion, Education, Exploration, Limit-setting, Orientation, Problem-solving, Rapport Building, Dance movement psychotherapisteality Testing, Socialization and Support  Summary of Progress/Problems: The topic for group therapy was feelings about diagnosis. Pt actively participated in group discussion on their past and current diagnosis and how they feel towards this. Pt also identified how society and family members judge them, based on their diagnosis as well as stereotypes and stigmas. Pt shared she was diagnosed with substance use disorder and depression and acknowledged that the pt would need help to treat the pt's diagnosis and that the pt could not "do it alone"but also needed 12-step groups which her boyfriend was not comfortable with. Pt shared that 12-step groups were uncomfortable for the pt also, but that the pt is willing to comply if the pt wants to achieve success as a sober person. Pt shared that the pt "got alot out of 12-step groups" and appreciated others being so open and honest. Pt was polite and cooperative with the CSW and other group members and focused and attentive to the topics discussed and the sharing of others.    Courtney PeaJonathan F. Jimel Hebert, LCSWA, LCAS

## 2015-11-08 LAB — CULTURE, BLOOD (ROUTINE X 2)
CULTURE: NO GROWTH
Culture: NO GROWTH

## 2015-11-17 ENCOUNTER — Encounter: Payer: Self-pay | Admitting: Emergency Medicine

## 2015-11-17 ENCOUNTER — Emergency Department
Admission: EM | Admit: 2015-11-17 | Discharge: 2015-11-17 | Disposition: A | Discharge status: Home / Self Care | Payer: MEDICAID | Attending: Emergency Medicine | Admitting: Emergency Medicine

## 2015-11-17 ENCOUNTER — Other Ambulatory Visit: Payer: Self-pay

## 2015-11-17 DIAGNOSIS — F314 Bipolar disorder, current episode depressed, severe, without psychotic features: Secondary | ICD-10-CM | POA: Insufficient documentation

## 2015-11-17 DIAGNOSIS — T402X4A Poisoning by other opioids, undetermined, initial encounter: Secondary | ICD-10-CM | POA: Insufficient documentation

## 2015-11-17 DIAGNOSIS — Z86718 Personal history of other venous thrombosis and embolism: Secondary | ICD-10-CM | POA: Insufficient documentation

## 2015-11-17 DIAGNOSIS — F1110 Opioid abuse, uncomplicated: Secondary | ICD-10-CM | POA: Insufficient documentation

## 2015-11-17 DIAGNOSIS — Z79899 Other long term (current) drug therapy: Secondary | ICD-10-CM | POA: Insufficient documentation

## 2015-11-17 DIAGNOSIS — F17210 Nicotine dependence, cigarettes, uncomplicated: Secondary | ICD-10-CM | POA: Insufficient documentation

## 2015-11-17 DIAGNOSIS — Z8669 Personal history of other diseases of the nervous system and sense organs: Secondary | ICD-10-CM | POA: Insufficient documentation

## 2015-11-17 LAB — URINE DRUG SCREEN, QUALITATIVE (ARMC ONLY)
Amphetamines, Ur Screen: NOT DETECTED
Barbiturates, Ur Screen: NOT DETECTED
Benzodiazepine, Ur Scrn: POSITIVE — AB
COCAINE METABOLITE, UR ~~LOC~~: NOT DETECTED
Cannabinoid 50 Ng, Ur ~~LOC~~: NOT DETECTED
MDMA (ECSTASY) UR SCREEN: NOT DETECTED
METHADONE SCREEN, URINE: POSITIVE — AB
OPIATE, UR SCREEN: NOT DETECTED
Phencyclidine (PCP) Ur S: NOT DETECTED
TRICYCLIC, UR SCREEN: POSITIVE — AB

## 2015-11-17 LAB — URINALYSIS COMPLETE WITH MICROSCOPIC (ARMC ONLY)
BILIRUBIN URINE: NEGATIVE
GLUCOSE, UA: 50 mg/dL — AB
KETONES UR: NEGATIVE mg/dL
Leukocytes, UA: NEGATIVE
NITRITE: NEGATIVE
Protein, ur: 100 mg/dL — AB
Specific Gravity, Urine: 1.024 (ref 1.005–1.030)
pH: 5 (ref 5.0–8.0)

## 2015-11-17 LAB — CBC WITH DIFFERENTIAL/PLATELET
BASOS ABS: 0.1 10*3/uL (ref 0–0.1)
Eosinophils Absolute: 0.3 10*3/uL (ref 0–0.7)
Eosinophils Relative: 3 %
HEMATOCRIT: 47 % (ref 35.0–47.0)
HEMOGLOBIN: 15.1 g/dL (ref 12.0–16.0)
Lymphs Abs: 1.8 10*3/uL (ref 1.0–3.6)
MCH: 28.1 pg (ref 26.0–34.0)
MCHC: 32.2 g/dL (ref 32.0–36.0)
MCV: 87.1 fL (ref 80.0–100.0)
MONO ABS: 0.3 10*3/uL (ref 0.2–0.9)
Monocytes Relative: 3 %
NEUTROS ABS: 8.3 10*3/uL — AB (ref 1.4–6.5)
Platelets: 355 10*3/uL (ref 150–440)
RBC: 5.4 MIL/uL — AB (ref 3.80–5.20)
RDW: 15.7 % — AB (ref 11.5–14.5)
WBC: 10.8 10*3/uL (ref 3.6–11.0)

## 2015-11-17 LAB — COMPREHENSIVE METABOLIC PANEL
ALT: 34 U/L (ref 14–54)
ANION GAP: 15 (ref 5–15)
AST: 41 U/L (ref 15–41)
Albumin: 4.2 g/dL (ref 3.5–5.0)
Alkaline Phosphatase: 78 U/L (ref 38–126)
BILIRUBIN TOTAL: 0.4 mg/dL (ref 0.3–1.2)
BUN: 11 mg/dL (ref 6–20)
CHLORIDE: 106 mmol/L (ref 101–111)
CO2: 16 mmol/L — ABNORMAL LOW (ref 22–32)
Calcium: 8.4 mg/dL — ABNORMAL LOW (ref 8.9–10.3)
Creatinine, Ser: 1.26 mg/dL — ABNORMAL HIGH (ref 0.44–1.00)
GFR, EST NON AFRICAN AMERICAN: 52 mL/min — AB (ref >60)
Glucose, Bld: 153 mg/dL — ABNORMAL HIGH (ref 65–99)
POTASSIUM: 3.6 mmol/L (ref 3.5–5.1)
Sodium: 137 mmol/L (ref 135–145)
TOTAL PROTEIN: 7.8 g/dL (ref 6.5–8.1)

## 2015-11-17 LAB — SALICYLATE LEVEL

## 2015-11-17 LAB — ACETAMINOPHEN LEVEL: Acetaminophen (Tylenol), Serum: <10 ug/mL — ABNORMAL LOW (ref 10–30)

## 2015-11-17 LAB — PREGNANCY, URINE: Preg Test, Ur: NEGATIVE

## 2015-11-17 LAB — ETHANOL

## 2015-11-17 MED ORDER — ONDANSETRON HCL 4 MG/2ML IJ SOLN
INTRAMUSCULAR | Status: AC
  Administered 2015-11-17: 4 mg via INTRAVENOUS
  Filled 2015-11-17: qty 2

## 2015-11-17 MED ORDER — SODIUM CHLORIDE 0.9 % IV BOLUS (SEPSIS)
1000.0000 mL | Freq: Once | INTRAVENOUS | Status: AC
  Administered 2015-11-17: 1000 mL via INTRAVENOUS

## 2015-11-17 MED ORDER — ONDANSETRON HCL 4 MG/2ML IJ SOLN
4.0000 mg | Freq: Once | INTRAMUSCULAR | Status: AC
  Administered 2015-11-17: 4 mg via INTRAVENOUS

## 2015-11-17 MED ORDER — ONDANSETRON 8 MG PO TBDP: 8.0000 mg | ORAL_TABLET | Freq: Once | ORAL | Status: DC

## 2015-11-17 MED ORDER — ONDANSETRON 8 MG PO TBDP
ORAL_TABLET | ORAL | Status: AC
  Filled 2015-11-17: qty 1
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
## 2015-11-17 NOTE — ED Notes (Signed)

## 2015-11-17 NOTE — ED Notes (Signed)
Patient reports this the 5 th time she has overdosed, reports has been cleaned from methadone for 3 weeks. Reports is waiting on insurance to be able to be placed in facility to help with drug use. Pt reports for the past day she has been having diarrhea and vomiting.
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
## 2015-11-17 NOTE — ED Notes (Signed)
MD at bedside. 

## 2015-11-17 NOTE — Discharge Instructions (Signed)
Please continue to seek help for your substance addiction.  Return to the emergency department for thoughts of wanting to hurt yourself or for any other concerns.   Narcotic Overdose A narcotic overdose is the misuse or overuse of a narcotic drug. A narcotic overdose can make you pass out and stop breathing. If you are not treated right away, this can cause permanent brain damage or stop your heart. Medicine may be given to reverse the effects of an overdose. If so, this medicine may bring on withdrawal symptoms. The symptoms may be abdominal cramps, throwing up (vomiting), sweating, chills, and nervousness. Injecting narcotics can cause more problems than just an overdose. AIDS, hepatitis, and other very serious infections are transmitted by sharing needles and syringes. If you decide to quit using, there are medicines which can help you through the withdrawal period. Trying to quit all at once on your own can be uncomfortable, but not life-threatening. Call your caregiver, Narcotics Anonymous, or any drug and alcohol treatment program for further help.    This information is not intended to replace advice given to you by your health care provider. Make sure you discuss any questions you have with your health care provider.   Document Released: 06/24/2004 Document Revised: 06/07/2014 Document Reviewed: 11/06/2014 Elsevier Interactive Patient Education [employer]  Drug Overdose Drug overdose happens when you take too much of a drug. An overdose can occur with illegal drugs, prescription drugs, or over-the-counter (OTC) drugs. The effects of drug overdose can be mild, dangerous, or even deadly. CAUSES Drug overdose may be caused by:  Taking too much of a drug on purpose.  Taking too much of a drug by accident.  An error made by a health care provider who prescribes a drug.  An error made by a pharmacist who fills the prescription order. Drugs that commonly cause overdose  include:  Mental health drugs.  Pain medicines.  Illegal drugs.  OTC cough and cold medicines.  Heart medicines.  Seizure medicines. RISK FACTORS Drug overdose is more likely in:  Children. They may be attracted to colorful pills. Because of children's small size, even a small amount of a drug can be dangerous.  Elderly people. They may be taking many different drugs. Elderly people may have difficulty reading labels or remembering when they last took their medicine. The risk of drug overdose is also higher for someone who:  Takes illegal drugs.  Takes a drug and drinks alcohol.  Has a mental health condition. SYMPTOMS Signs and symptoms of drug overdose depend on the drug and the amount that was taken. Common danger signs include:  Behavior changes.  Sleepiness.  Slowed breathing.  Nausea and vomiting.  Seizures.  Changes in eye pupil size (very large or very small). If there are signs of very low blood pressure from a drug overdose (shock), emergency treatment is required. These signs include:  Cold and clammy skin.  Pale skin.  Blue lips.  Very slow breathing.  Extreme sleepiness.  Loss of consciousness. DIAGNOSIS Drug overdose may be diagnosed based on your symptoms. It is important that you tell your health care provider:  All of the drugs that you have taken.  When you took the drugs.  Whether you were drinking alcohol. Your health care provider will do a physical exam. This exam may include:  Checking and monitoring your heart rate and rhythm, your temperature, and your blood pressure (vital signs).  Checking your breathing and oxygen level. You may also have tests,  including:   Urine tests to check for drugs in your system.  Blood tests to check for:  Drugs in your system.  Signs of an imbalance of your blood minerals (electrolytes).  Liver damage.  Kidney damage. TREATMENT Supporting your vital signs and your breathing is the  first step in treating a drug overdose. Treatment may also include:  Receiving fluids and electrolytes through an IV tube.  Having a breathing tube (endotracheal tube) inserted in your airway to help you breathe.  Having a tube passed through your nose and into your stomach (nasogastric tube) to wash out your stomach.  Medicines. You may get medicines to:  Make you vomit.  Absorb any medicine that is left in your digestive system (activated charcoal).  Block or reverse the effect of the drug that caused the overdose.  Having your blood filtered through an artificial kidney machine (hemodialysis). You may need this if your overdose is severe or if you have kidney failure.  Having ongoing counseling and mental health support if you intentionally overdosed or used an illegal drug. HOME CARE INSTRUCTIONS  Take medicines only as directed by your health care provider. Always ask your health care provider to discuss the possible side effects of any new drug that you start taking.  Keep a list of all of the drugs that you take, including over-the-counter medicines. Bring this list with you to all of your medical visits.  Read the drug inserts that come with your medicines.  Do not use illegal drugs.  Do not drink alcohol when taking drugs.  Store all medicines in safety containers that are out of the reach of children.  Keep the phone number of your local poison control center near your phone or on your cell phone.  Get help if you are struggling with alcohol or drug use.  Get help if you are struggling with depression or another mental health problem.  Keep all follow-up visits as directed by your health care provider. This is important. SEEK MEDICAL CARE IF:  Your symptoms return.  You develop any new signs or symptoms when you are taking medicines. SEEK IMMEDIATE MEDICAL CARE IF:  You think that you or someone else may have taken too much of a drug. The hotline of the  [facility] is (619)832-8490(800) (902)234-0336.  You or someone else is having symptoms of a drug overdose.  You have serious thoughts about hurting yourself or others.  You have chest pain.  You have difficulty breathing.  You have a loss of consciousness. Drug overdose is an emergency. Do not wait to see if the symptoms will go away. Get medical help right away. Call your local emergency services (911 in the [place] Do not drive yourself to the hospital.   This information is not intended to replace advice given to you by your health care provider. Make sure you discuss any questions you have with your health care provider.   Document Released: 10/01/2014 Document Reviewed: 10/01/2014 Elsevier Interactive Patient Education [employer]

## 2015-11-17 NOTE — ED Notes (Signed)
Patient presents to ED via [facility] from home, family called EMS reports finding patient overdosed on hydrocodone. Per EMS upon their arrival patient was found unresponsive, agonal breathing, and purple. Per EMS patient given 2 mg Narcan IM by [facility], 2 mg Narcan intranasal by EMS, and 2 mg Narcan IO by EMS. Upon arrival to hospital patient alert and oriented x 4, respirations even and unlabored, skin color appropriate to ethnicity, skin warm and dry. Patient denies suicidal ideation, denies HI. Stats she would like help with detox. IO inserted by [facility] to right leg.

## 2015-11-17 NOTE — ED Notes (Signed)
Patient calling out to RN station stating "I am ready to go, it is 9 o'clock and I am ready to go." Informed patient Behavioral intake nurse will work on possible getting patient accepted to [facility], patient states "I don't want to go to a homeless shelter" informed pt it was a facility to assist with detox. Patient states "I have been to state owned facilities and they have not worked, it takes 3 days to detox and I have been to these facilities they don't help." Patient repeating she wants to be discharged.

## 2015-11-17 NOTE — ED Notes (Signed)
MD made aware of patient request.

## 2015-11-20 ENCOUNTER — Encounter: Payer: Self-pay | Admitting: Emergency Medicine

## 2015-11-20 ENCOUNTER — Emergency Department
Admission: EM | Admit: 2015-11-20 | Discharge: 2015-11-20 | Disposition: A | Discharge status: Home / Self Care | Payer: Self-pay | Attending: Emergency Medicine | Admitting: Emergency Medicine

## 2015-11-20 ENCOUNTER — Emergency Department: Payer: Self-pay

## 2015-11-20 ENCOUNTER — Other Ambulatory Visit: Payer: Self-pay

## 2015-11-20 DIAGNOSIS — Z8669 Personal history of other diseases of the nervous system and sense organs: Secondary | ICD-10-CM | POA: Insufficient documentation

## 2015-11-20 DIAGNOSIS — F17210 Nicotine dependence, cigarettes, uncomplicated: Secondary | ICD-10-CM | POA: Insufficient documentation

## 2015-11-20 DIAGNOSIS — R079 Chest pain, unspecified: Secondary | ICD-10-CM

## 2015-11-20 DIAGNOSIS — F314 Bipolar disorder, current episode depressed, severe, without psychotic features: Secondary | ICD-10-CM | POA: Insufficient documentation

## 2015-11-20 DIAGNOSIS — F1121 Opioid dependence, in remission: Secondary | ICD-10-CM | POA: Insufficient documentation

## 2015-11-20 DIAGNOSIS — Z86718 Personal history of other venous thrombosis and embolism: Secondary | ICD-10-CM | POA: Insufficient documentation

## 2015-11-20 DIAGNOSIS — R0789 Other chest pain: Secondary | ICD-10-CM | POA: Insufficient documentation

## 2015-11-20 LAB — CBC
HCT: 42.7 % (ref 35.0–47.0)
HEMOGLOBIN: 14.6 g/dL (ref 12.0–16.0)
MCH: 28.9 pg (ref 26.0–34.0)
MCHC: 34.1 g/dL (ref 32.0–36.0)
MCV: 84.8 fL (ref 80.0–100.0)
Platelets: 245 10*3/uL (ref 150–440)
RBC: 5.04 MIL/uL (ref 3.80–5.20)
RDW: 15.7 % — ABNORMAL HIGH (ref 11.5–14.5)
WBC: 8.4 10*3/uL (ref 3.6–11.0)

## 2015-11-20 LAB — BASIC METABOLIC PANEL
ANION GAP: 9 (ref 5–15)
BUN: 6 mg/dL (ref 6–20)
CHLORIDE: 100 mmol/L — AB (ref 101–111)
CO2: 26 mmol/L (ref 22–32)
CREATININE: 0.84 mg/dL (ref 0.44–1.00)
Calcium: 8.4 mg/dL — ABNORMAL LOW (ref 8.9–10.3)
GFR calc non Af Amer: >60 mL/min (ref >60)
Glucose, Bld: 112 mg/dL — ABNORMAL HIGH (ref 65–99)
Potassium: 3.9 mmol/L (ref 3.5–5.1)
SODIUM: 135 mmol/L (ref 135–145)

## 2015-11-20 LAB — FIBRIN DERIVATIVES D-DIMER (ARMC ONLY): Fibrin derivatives D-dimer (ARMC): 965 — ABNORMAL HIGH (ref 0–499)

## 2015-11-20 LAB — PROTIME-INR
INR: 1.13
PROTHROMBIN TIME: 14.7 s (ref 11.4–15.0)

## 2015-11-20 LAB — TROPONIN I

## 2015-11-20 MED ORDER — GI COCKTAIL ~~LOC~~
30.0000 mL | Freq: Once | ORAL | Status: AC
Start: 2015-11-20 — End: 2015-11-20
  Administered 2015-11-20: 30 mL via ORAL
  Filled 2015-11-20: qty 30

## 2015-11-20 MED ORDER — PANTOPRAZOLE SODIUM 20 MG PO TBEC: 20.0000 mg | DELAYED_RELEASE_TABLET | Freq: Every day | ORAL | Status: AC

## 2015-11-20 MED ORDER — IOPAMIDOL (ISOVUE-370) INJECTION 76%
100.0000 mL | Freq: Once | INTRAVENOUS | Status: AC | PRN
  Administered 2015-11-20: 100 mL via INTRAVENOUS

## 2015-11-20 NOTE — ED Notes (Signed)
Patient transported to X-ray 

## 2015-11-20 NOTE — ED Notes (Signed)
Pt states chest pain under her right breast area and SOB, states she missed 2 doses of her xarelto when she overdosed last week, pt awake and alert at present, transported to xray

## 2015-11-20 NOTE — ED Provider Notes (Signed)
Time Seen: Approximately *1140 I have reviewed the triage notes  Chief Complaint: Chest Pain   History of Present Illness: Courtney Hebert is a 40 y.o. female *who presents with substernal chest discomfort that's been occurring now constantly for the last 2 days. Patient has a recent history of opioid drug abuse and had an episode and required CPR and resuscitation etc. Patient states she's not used any opioid drugs over the last 48 hours. She also has a history of previous pulmonary embolism and is on chronic Xarelto. The patient denies any cardiovascular history. Patient denies any new calf tenderness or swelling. She denies any arm, jaw, flank pain. She denies any suicidal thoughts or homicidal thoughts. She denies any obvious exacerbating or relieving factors. She denies any positional component to her pain. Past Medical History  Diagnosis Date  . Chronic back pain   . Vascular disease   . DVT (deep venous thrombosis) (HCC)   . Bipolar 1 disorder (HCC)   . Sleep apnea   . Anxiety   . Seizures [facility](HCC)     Patient Active Problem List   Diagnosis Date Noted  . Bipolar I disorder, most recent episode depressed, severe without psychotic features (HCC) 11/04/2015  . Opioid overdose 11/04/2015  . Suicidal ideation 11/02/2015  . Involuntary commitment 11/02/2015  . Sedative, hypnotic or anxiolytic use disorder, severe, dependence (HCC) 11/02/2015  . Pulmonary embolism (HCC) 11/01/2015  . Opioid use disorder, severe, in early remission, on maintenance therapy 10/03/2015  . Tobacco use disorder 10/03/2015  . Migraine 10/03/2015    Past Surgical History  Procedure Laterality Date  . Shoulder surgery    . Cesarean section      Past Surgical History  Procedure Laterality Date  . Shoulder surgery    . Cesarean section      Current Outpatient Rx  Name  Route  Sig  Dispense  Refill  . lithium carbonate 300 MG capsule   Oral   Take 1 capsule (300 mg total) by mouth daily.   30  capsule   0   . ondansetron (ZOFRAN) 4 MG tablet   Oral   Take 1 tablet (4 mg total) by mouth every 8 (eight) hours as needed for nausea or vomiting.   20 tablet   0   . Rivaroxaban (XARELTO) 15 MG TABS tablet   Oral   Take 1 tablet (15 mg total) by mouth 2 (two) times daily with a meal.   42 tablet   0   . rivaroxaban (XARELTO) 20 MG TABS tablet   Oral   Take 1 tablet (20 mg total) by mouth daily. Patient taking differently: Take 20 mg by mouth daily. (to start on 11/23/15 once xarelto 15 mg is completed)   30 tablet   1   . tiZANidine (ZANAFLEX) 2 MG tablet   Oral   Take 2 mg by mouth 2 (two) times daily.         . hydrochlorothiazide (HYDRODIURIL) 25 MG tablet   Oral   Take 1 tablet (25 mg total) by mouth daily. Reported on 11/04/2015 Patient not taking: Reported on 11/20/2015   30 tablet   0   . lamoTRIgine (LAMICTAL) 25 MG tablet   Oral   Take 1 tablet (25 mg total) by mouth at bedtime. Patient not taking: Reported on 11/20/2015   45 tablet   0     Take 1 tab at night for 2 weeks, 2 tabs for 2 week ...   .Marland Kitchen  pantoprazole (PROTONIX) 20 MG tablet   Oral   Take 1 tablet (20 mg total) by mouth daily.   30 tablet   1     Allergies:  Vancomycin and Narcan  Family History: No family history on file.  Social History: Social History  Substance Use Topics  . Smoking status: Current Every Day Smoker -- 1.00 packs/day    Types: Cigarettes  . Smokeless tobacco: None  . Alcohol Use: Yes     Review of Systems:   10 point review of systems was performed and was otherwise negative:  Constitutional: No fever Eyes: No visual disturbances ENT: No sore throat, ear pain Cardiac: Substernal chest discomfort without radiation. Patient has difficulty characterizing the discomfort. Respiratory: No shortness of breath, wheezing, or stridor Abdomen: No abdominal pain, no vomiting, No diarrhea Endocrine: No weight loss, No night sweats Extremities: No peripheral edema,  cyanosis Skin: No rashes, easy bruising Neurologic: No focal weakness, trouble with speech or swollowing Urologic: No dysuria, Hematuria, or urinary frequency   Physical Exam:  ED Triage Vitals  Enc Vitals Group     BP 11/20/15 0914 139/69 mmHg     Pulse Rate 11/20/15 0914 77     Resp 11/20/15 0914 22     Temp 11/20/15 0914 98.4 F (36.9 C)     Temp Source 11/20/15 0914 Oral     SpO2 11/20/15 0914 96 %     Weight 11/20/15 0914 250 lb (113.399 kg)     Height 11/20/15 0914  (1.702 m)     Head Cir --      Peak Flow --      Pain Score 11/20/15 0909 5     Pain Loc --      Pain Edu? --      Excl. in GC? --     General: Awake , Alert , and Oriented times 3; GCS 15 Head: Normal cephalic , atraumatic Eyes: Pupils equal , round, reactive to light Nose/Throat: No nasal drainage, patent upper airway without erythema or exudate.  Neck: Supple, Full range of motion, No anterior adenopathy or palpable thyroid masses Lungs: Clear to ascultation without wheezes , rhonchi, or rales Heart: Regular rate, regular rhythm without murmurs , gallops , or rubs Abdomen: Soft, non tender without rebound, guarding , or rigidity; bowel sounds positive and symmetric in all 4 quadrants. No organomegaly .        Extremities: Patient has bilateral scarring from previous IV drug usage. No obvious focal calf tenderness or edema. Neurologic: normal ambulation, Motor symmetric without deficits, sensory intact Skin: warm, dry, no rashes No reproducible chest wall discomfort.  Labs:   All laboratory work was reviewed including any pertinent negatives or positives listed below:  Labs Reviewed  CBC - Abnormal; Notable for the following:    RDW 15.7 (*)    All other components within normal limits  BASIC METABOLIC PANEL - Abnormal; Notable for the following:    Chloride 100 (*)    Glucose, Bld 112 (*)    Calcium 8.4 (*)    All other components within normal limits  FIBRIN DERIVATIVES D-DIMER ([facility] ONLY)  - Abnormal; Notable for the following:    Fibrin derivatives D-dimer ([facility]) 965 (*)    All other components within normal limits  PROTIME-INR  TROPONIN I  Troponin is negative and D-dimer test is positive..  EKG: * ED ECG REPORT I, Jennye Moccasin, the attending physician, personally viewed and interpreted this ECG.  Date: 11/20/2015  EKG Time: *0 911 Rate: 77 Rhythm: normal sinus rhythm QRS Axis: normal Intervals: normal ST/T Wave abnormalities: Persistent T-wave abnormalities seen in leads 3, aVF, and anterior leads Conduction Disturbances: none Narrative Interpretation: unremarkable Left ventricular hypertrophy No significant change except T waves more evident in comparison to 11-17-2015   Radiology:      CT ANGIO CHEST PE W OR WO CONTRAST (Final result) Result time: 11/20/15 12:07:14   Final result by Rad Results In Interface (11/20/15 12:07:14)   Narrative:   CLINICAL DATA: Anterior right chest pain for 3 days. History of prior pulmonary embolus.  EXAM: CT ANGIOGRAPHY CHEST WITH CONTRAST  TECHNIQUE: Multidetector CT imaging of the chest was performed using the standard protocol during bolus administration of intravenous contrast. Multiplanar CT image reconstructions and MIPs were obtained to evaluate the vascular anatomy.  CONTRAST: 100 cc Isovue 370.  COMPARISON: CT chest 11/01/2015. PA and lateral chest earlier today.  FINDINGS: No pulmonary embolus is identified. Heart size is mildly enlarged. No pleural or pericardial effusion. No axillary, hilar or mediastinal lymphadenopathy. The lungs are clear imaged upper abdomen is unremarkable. No focal bony abnormality is identified.  Review of the MIP images confirms the above findings.  IMPRESSION: Negative for pulmonary embolus. Emboli in the left lower and right upper lobe seen on the patient's comparison CT scan proximal 2 weeks ago are no longer visualized. No acute  disease.  Cardiomegaly.   Electronically Signed By: Drusilla Kannerhomas Dalessio M.D. On: 11/20/2015 12:07          DG Chest 2 View (Final result) Result time: 11/20/15 10:12:09   Final result by Rad Results In Interface (11/20/15 10:12:09)   Narrative:   CLINICAL DATA: Chest pain  EXAM: CHEST 2 VIEW  COMPARISON: 11/01/2015  FINDINGS: The heart size and mediastinal contours are within normal limits. Both lungs are clear. The visualized skeletal structures are unremarkable.  IMPRESSION: No active cardiopulmonary disease.   Electronically Signed By: Marlan Palauharles Clark M.D. On: 11/20/2015 10:12        I personally reviewed the radiologic studies   P ED Course:  Patient's stay here was uneventful and she had IV access established per ultrasound. Patient's chest discomfort I thought that more of a GI picture than a life-threatening cause such as for her pericarditis, valvular heart disease, acute coronary syndrome, pulmonary embolism, etc. The patient's chest CT does not show any evidence of active pulmonary embolism at this point. Her EKG findings appear to be evident and previous EKG. With her having pain over a two-day span relatively constantly and a negative troponin I felt this was unlikely to be acute coronary syndrome. Patient appears to be of understanding was advised continue with her as well to at home. She was advised take Tylenol for pain and to contact her primary physician for further outpatient follow-up.    Assessment: *Acute unspecified chest pain   Final Clinical Impression:  Final diagnoses:  Chest pain     Plan: * Outpatient Prescription for Protonix Patient was advised to return immediately if condition worsens. Patient was advised to follow up with their primary care physician or other specialized physicians involved in their outpatient care. The patient and/or family member/power of attorney had laboratory results reviewed at the  bedside. All questions and concerns were addressed and appropriate discharge instructions were distributed by the nursing staff.           Jennye MoccasinBrian S Kwasi Joung, MD 11/20/15 1245

## 2015-11-20 NOTE — Discharge Instructions (Signed)
Nonspecific Chest Pain It is often hard to find the cause of chest pain. There is always a chance that your pain could be related to something serious, such as a heart attack or a blood clot in your lungs. Chest pain can also be caused by conditions that are not life-threatening. If you have chest pain, it is very important to follow up with your doctor.  HOME CARE  If you were prescribed an antibiotic medicine, finish it all even if you start to feel better.  Avoid any activities that cause chest pain.  Do not use any tobacco products, including cigarettes, chewing tobacco, or electronic cigarettes. If you need help quitting, ask your doctor.  Do not drink alcohol.  Take medicines only as told by your doctor.  Keep all follow-up visits as told by your doctor. This is important. This includes any further testing if your chest pain does not go away.  Your doctor may tell you to keep your head raised (elevated) while you sleep.  Make lifestyle changes as told by your doctor. These may include:  Getting regular exercise. Ask your doctor to suggest some activities that are safe for you.  Eating a heart-healthy diet. Your doctor or a diet specialist (dietitian) can help you to learn healthy eating options.  Maintaining a healthy weight.  Managing diabetes, if necessary.  Reducing stress. GET HELP IF:  Your chest pain does not go away, even after treatment.  You have a rash with blisters on your chest.  You have a fever. GET HELP RIGHT AWAY IF:  Your chest pain is worse.  You have an increasing cough, or you cough up blood.  You have severe belly (abdominal) pain.  You feel extremely weak.  You pass out (faint).  You have chills.  You have sudden, unexplained chest discomfort.  You have sudden, unexplained discomfort in your arms, back, neck, or jaw.  You have shortness of breath at any time.  You suddenly start to sweat, or your skin gets clammy.  You feel  nauseous.  You vomit.  You suddenly feel light-headed or dizzy.  Your heart begins to beat quickly, or it feels like it is skipping beats. These symptoms may be an emergency. Do not wait to see if the symptoms will go away. Get medical help right away. Call your local emergency services (911 in the [place] Do not drive yourself to the hospital.   This information is not intended to replace advice given to you by your health care provider. Make sure you discuss any questions you have with your health care provider.   Document Released: 11/03/2007 Document Revised: 06/07/2014 Document Reviewed: 12/21/2013 Elsevier Interactive Patient Education 2016 [employer]   Please take Tylenol for pain and we will start she up on an antiacid medication and continue your Xeralto. You may need further outpatient testing such as a stress echocardiogram, etc.

## 2015-11-20 NOTE — ED Notes (Signed)
Pt. Verbalizes understanding of d/c instructions, prescriptions, and follow-up. VS stable.  Pt. In NAD at time of d/c and denies concerns. Pt. Ambulatory Out of the unit with family.

## 2015-11-20 NOTE — ED Notes (Signed)
Pt returned form xray

## 2015-11-20 NOTE — ED Notes (Signed)
Pt complains of pain to right side of chest for 3 days. Pt states she has known blood clots in right lung. Pt states she overdosed three days ago and was seen here, pt states pain started after that.

## 2015-12-17 ENCOUNTER — Emergency Department
Admission: EM | Admit: 2015-12-17 | Discharge: 2015-12-17 | Disposition: A | Discharge status: Home / Self Care | Payer: Self-pay | Attending: Student | Admitting: Student

## 2015-12-17 ENCOUNTER — Encounter: Payer: Self-pay | Admitting: Emergency Medicine

## 2015-12-17 DIAGNOSIS — F1193 Opioid use, unspecified with withdrawal: Secondary | ICD-10-CM

## 2015-12-17 DIAGNOSIS — Z79899 Other long term (current) drug therapy: Secondary | ICD-10-CM | POA: Insufficient documentation

## 2015-12-17 DIAGNOSIS — Z86711 Personal history of pulmonary embolism: Secondary | ICD-10-CM | POA: Insufficient documentation

## 2015-12-17 DIAGNOSIS — F1123 Opioid dependence with withdrawal: Secondary | ICD-10-CM | POA: Insufficient documentation

## 2015-12-17 DIAGNOSIS — F314 Bipolar disorder, current episode depressed, severe, without psychotic features: Secondary | ICD-10-CM | POA: Insufficient documentation

## 2015-12-17 DIAGNOSIS — Z86718 Personal history of other venous thrombosis and embolism: Secondary | ICD-10-CM | POA: Insufficient documentation

## 2015-12-17 DIAGNOSIS — F17210 Nicotine dependence, cigarettes, uncomplicated: Secondary | ICD-10-CM | POA: Insufficient documentation

## 2015-12-17 LAB — COMPREHENSIVE METABOLIC PANEL WITH GFR
ALT: 12 U/L — ABNORMAL LOW (ref 14–54)
AST: 25 U/L (ref 15–41)
Albumin: 3.6 g/dL (ref 3.5–5.0)
Alkaline Phosphatase: 81 U/L (ref 38–126)
Anion gap: 8 (ref 5–15)
BUN: 12 mg/dL (ref 6–20)
CO2: 22 mmol/L (ref 22–32)
Calcium: 9.2 mg/dL (ref 8.9–10.3)
Chloride: 103 mmol/L (ref 101–111)
Creatinine, Ser: 0.81 mg/dL (ref 0.44–1.00)
GFR calc Af Amer: >60 mL/min
GFR calc non Af Amer: >60 mL/min
Glucose, Bld: 121 mg/dL — ABNORMAL HIGH (ref 65–99)
Potassium: 4.2 mmol/L (ref 3.5–5.1)
Sodium: 133 mmol/L — ABNORMAL LOW (ref 135–145)
Total Bilirubin: 0.6 mg/dL (ref 0.3–1.2)
Total Protein: 7.5 g/dL (ref 6.5–8.1)

## 2015-12-17 LAB — CBC WITH DIFFERENTIAL/PLATELET
Basophils Absolute: 0.1 K/uL (ref 0–0.1)
Basophils Relative: 1 %
Eosinophils Absolute: 0 K/uL (ref 0–0.7)
Eosinophils Relative: 0 %
HCT: 44.9 % (ref 35.0–47.0)
Hemoglobin: 15.2 g/dL (ref 12.0–16.0)
Lymphocytes Relative: 16 %
Lymphs Abs: 1.5 K/uL (ref 1.0–3.6)
MCH: 27.9 pg (ref 26.0–34.0)
MCHC: 33.8 g/dL (ref 32.0–36.0)
MCV: 82.5 fL (ref 80.0–100.0)
Monocytes Absolute: 0.2 K/uL (ref 0.2–0.9)
Monocytes Relative: 3 %
Neutro Abs: 7.1 K/uL — ABNORMAL HIGH (ref 1.4–6.5)
Neutrophils Relative %: 80 %
Platelets: 335 K/uL (ref 150–440)
RBC: 5.44 MIL/uL — ABNORMAL HIGH (ref 3.80–5.20)
RDW: 15.7 % — ABNORMAL HIGH (ref 11.5–14.5)
WBC: 8.9 K/uL (ref 3.6–11.0)

## 2015-12-17 LAB — URINE DRUG SCREEN, QUALITATIVE (ARMC ONLY)
Amphetamines, Ur Screen: NOT DETECTED
BARBITURATES, UR SCREEN: NOT DETECTED
Benzodiazepine, Ur Scrn: NOT DETECTED
CANNABINOID 50 NG, UR ~~LOC~~: NOT DETECTED
COCAINE METABOLITE, UR ~~LOC~~: NOT DETECTED
MDMA (ECSTASY) UR SCREEN: NOT DETECTED
Methadone Scn, Ur: NOT DETECTED
Opiate, Ur Screen: NOT DETECTED
PHENCYCLIDINE (PCP) UR S: NOT DETECTED
TRICYCLIC, UR SCREEN: NOT DETECTED

## 2015-12-17 LAB — SALICYLATE LEVEL: Salicylate Lvl: <4 mg/dL (ref 2.8–30.0)

## 2015-12-17 LAB — ETHANOL: Alcohol, Ethyl (B): <5 mg/dL (ref <5)

## 2015-12-17 LAB — ACETAMINOPHEN LEVEL: Acetaminophen (Tylenol), Serum: <10 ug/mL — ABNORMAL LOW (ref 10–30)

## 2015-12-17 MED ORDER — CLONIDINE HCL 0.1 MG/24HR TD PTWK: 0.1000 mg | MEDICATED_PATCH | TRANSDERMAL | Status: AC

## 2015-12-17 MED ORDER — LORAZEPAM 2 MG/ML IJ SOLN
1.0000 mg | Freq: Once | INTRAMUSCULAR | Status: AC
  Administered 2015-12-17: 1 mg via INTRAVENOUS
  Filled 2015-12-17: qty 1

## 2015-12-17 MED ORDER — LORAZEPAM 2 MG/ML IJ SOLN
2.0000 mg | Freq: Once | INTRAMUSCULAR | Status: AC
  Administered 2015-12-17: 2 mg via INTRAVENOUS
  Filled 2015-12-17: qty 1

## 2015-12-17 MED ORDER — KETOROLAC TROMETHAMINE 30 MG/ML IJ SOLN
30.0000 mg | Freq: Once | INTRAMUSCULAR | Status: AC
  Administered 2015-12-17: 30 mg via INTRAVENOUS
  Filled 2015-12-17: qty 1

## 2015-12-17 MED ORDER — CYCLOBENZAPRINE HCL 10 MG PO TABS: 10.0000 mg | ORAL_TABLET | Freq: Three times a day (TID) | ORAL | Status: AC | PRN

## 2015-12-17 NOTE — ED Notes (Signed)
Patient able to ambulate to restroom without difficulty.

## 2015-12-17 NOTE — Discharge Instructions (Signed)
Opioid Use Disorder °Opioid use disorder is a mental disorder. It is the continued nonmedical use of opioids in spite of risks to health and well-being. Misused opioids include the street drug heroin. They also include pain medicines such as morphine, hydrocodone, oxycodone, and fentanyl. Opioids are very addictive. People who misuse opioids get an exaggerated feeling of well-being. Opioid use disorder often disrupts activities at home, work, or school. It may cause mental or physical problems.  °A family history of opioid use disorder puts you at higher risk of it. People with opioid use disorder often misuse other drugs or have mental illness such as depression, posttraumatic stress disorder, or antisocial personality disorder. They also are at risk of suicide and death from overdose. °SIGNS AND SYMPTOMS  °Signs and symptoms of opioid use disorder include: °· Use of opioids in larger amounts or over a longer period than intended. °· Unsuccessful attempts to cut down or control opioid use. °· A lot of time spent obtaining, using, or recovering from the effects of opioids. °· A strong desire or urge to use opioids (craving). °· Continued use of opioids in spite of major problems at work, school, or home because of use. °· Continued use of opioids in spite of relationship problems because of use. °· Giving up or cutting down on important life activities because of opioid use. °· Use of opioids over and over in situations when it is physically hazardous, such as driving a car. °· Continued use of opioids in spite of a physical problem that is likely related to use. Physical problems can include: °· Severe constipation. °· Poor nutrition. °· Infertility. °· Tuberculosis. °· Aspiration pneumonia. °· Infections such as human immunodeficiency virus (HIV) and hepatitis (from injecting opioids). °· Continued use of opioids in spite of a mental problem that is likely related to use. Mental problems can  include: °· Depression. °· Anxiety. °· Hallucinations. °· Sleep problems. °· Loss of sexual function. °· Need to use more and more opioids to get the same effect, or lessened effect over time with use of the same amount (tolerance). °· Having withdrawal symptoms when opioid use is stopped, or using opioids to reduce or avoid withdrawal symptoms. Withdrawal symptoms include: °· Depressed, anxious, or irritable mood. °· Nausea, vomiting, diarrhea, or intestinal cramping. °· Muscle aches or spasms. °· Excessive tearing or runny nose. °· Dilated pupils, sweating, or hairs standing on end. °· Yawning. °· Fever, raised blood pressure, or fast pulse. °· Restlessness or trouble sleeping. This does not apply to people taking opioids for medical reasons only. °DIAGNOSIS °Opioid use disorder is diagnosed by your health care provider. You may be asked questions about your opioid use and and how it affects your life. A physical exam may be done. A drug screen may be ordered. You may be referred to a mental health professional. The diagnosis of opioid use disorder requires at least two symptoms within 12 months. The type of opioid use disorder you have depends on the number of signs and symptoms you have. The type may be: °· Mild. Two or three signs and symptoms.    °· Moderate. Four or five signs and symptoms.   °· Severe. Six or more signs and symptoms. °TREATMENT  °Treatment is usually provided by mental health professionals with training in substance use disorders. The following options are available: °· Detoxification. This is the first step in treatment for withdrawal. It is medically supervised withdrawal with the use of medicines. These medicines lessen withdrawal symptoms. They also raise the chance   of becoming opioid free. °· Counseling, also known as talk therapy. Talk therapy addresses the reasons you use opioids. It also addresses ways to keep you from using again (relapse). The goals of talk therapy are to avoid  relapse by: °· Identifying and avoiding triggers for use. °· Finding healthy ways to cope with stress. °· Learning how to handle cravings. °· Support groups. Support groups provide emotional support, advice, and guidance. °· A medicine that blocks opioid receptors in your brain. This medicine can reduce opioid cravings that lead to relapse. This medicine also blocks the desired opioid effect when relapse occurs. °· Opioids that are taken by mouth in place of the misused opioid (opioid maintenance treatment). These medicines satisfy cravings but are safer than commonly misused opioids. This often is the best option for people who continue to relapse with other treatments. °HOME CARE INSTRUCTIONS  °· Take medicines only as directed by your health care provider. °· Check with your health care provider before starting new medicines. °· Keep all follow-up visits as directed by your health care provider. °SEEK MEDICAL CARE IF: °· You are not able to take your medicines as directed. °· Your symptoms get worse. °SEEK IMMEDIATE MEDICAL CARE IF: °· You have serious thoughts about hurting yourself or others. °· You may have taken an overdose of opioids. °FOR MORE INFORMATION °· National Institute on Drug Abuse: www.drugabuse.gov °· Substance Abuse and Mental Health Services Administration: www.samhsa.gov °  °This information is not intended to replace advice given to you by your health care provider. Make sure you discuss any questions you have with your health care provider. °  °Document Released: 03/14/2007 Document Revised: 06/07/2014 Document Reviewed: 05/30/2013 °Elsevier Interactive Patient Education ©2016 Elsevier Inc. ° °Opioid Withdrawal °Opioids are a group of narcotic drugs. They include the street drug heroin. They also include pain medicines, such as morphine, hydrocodone, oxycodone, and fentanyl. Opioid withdrawal is a group of characteristic physical and mental signs and symptoms. It typically occurs if you have  been using opioids daily for several weeks or longer and stop using or rapidly decrease use. Opioid withdrawal can also occur if you have used opioids daily for a long time and are given a medicine to block the effect.  °SIGNS AND SYMPTOMS °Opioid withdrawal includes three or more of the following symptoms:  °· Depressed, anxious, or irritable mood. °· Nausea or vomiting. °· Muscle aches or spasms.   °· Watery eyes.    °· Runny nose. °· Dilated pupils, sweating, or hairs standing on end. °· Diarrhea or intestinal cramping. °· Yawning.   °· Fever. °· Increased blood pressure. °· Fast pulse. °· Restlessness or trouble sleeping. °These signs and symptoms occur within several hours of stopping or reducing short-acting opioids, such as heroin. They can occur within 3 days of stopping or reducing long-acting opioids, such as methadone. Withdrawal begins within minutes of receiving a drug that blocks the effects of opioids, such as naltrexone or naloxone. °DIAGNOSIS  °Opioid use disorder is diagnosed by your health care provider. You will be asked about your symptoms, drug and alcohol use, medical history, and use of medicines. A physical exam may be done. Lab tests may be ordered. Your health care provider may have you see a mental health professional.  °TREATMENT  °The treatment for opioid withdrawal is usually provided by medical doctors with special training in substance use disorders (addiction specialists). The following medicines may be included in treatment: °· Opioids given in place of the abused opioid. They turn on opioid   receptors in the brain and lessen or prevent withdrawal symptoms. They are gradually decreased (opioid substitution and taper).  Non-opioids that can lessen certain opioid withdrawal symptoms. They may be used alone or with opioid substitution and taper. Successful long-term recovery usually requires medicine, counseling, and group support. HOME CARE INSTRUCTIONS   Take medicines only as  directed by your health care provider.  Check with your health care provider before starting new medicines.  Keep all follow-up visits as directed by your health care provider. SEEK MEDICAL CARE IF:  You are not able to take your medicines as directed.  Your symptoms get worse.  You relapse. SEEK IMMEDIATE MEDICAL CARE IF:  You have serious thoughts about hurting yourself or others.  You have a seizure.  You lose consciousness.   This information is not intended to replace advice given to you by your health care provider. Make sure you discuss any questions you have with your health care provider.   Document Released: 05/20/2003 Document Revised: 06/07/2014 Document Reviewed: 05/30/2013 Elsevier Interactive Patient Education [employer] Inc.

## 2015-12-17 NOTE — BH Assessment (Signed)
[facility] ([phone]), they have not available beds on the Observation Unit. Writer informed ER MD (Dr. Mayford KnifeWilliams).

## 2015-12-17 NOTE — ED Provider Notes (Addendum)
[facility] Emergency Department Provider Note        Time seen: ----------------------------------------- 11:11 AM on 12/17/2015 -----------------------------------------    I have reviewed the triage vital signs and the nursing notes.   HISTORY  Chief Complaint Substance Withdrawal     HPI Courtney Hebert is a 40 y.o. female who arrives via EMS stating she is withdrawing from fentanyl. Patient states she has ordered fentanyl from [place] and has been using it intravenously. Recently she's been taking it orally. She is having severe aches and pains, reports pain is in her veins in all of her body. Patient with a long history of substance abuse. Nothing makes her symptoms better or worse.    Past Medical History  Diagnosis Date  . Chronic back pain   . Vascular disease   . DVT (deep venous thrombosis) (HCC)   . Bipolar 1 disorder (HCC)   . Sleep apnea   . Anxiety   . Seizures [facility])     Patient Active Problem List   Diagnosis Date Noted  . Bipolar I disorder, most recent episode depressed, severe without psychotic features (HCC) 11/04/2015  . Opioid overdose 11/04/2015  . Suicidal ideation 11/02/2015  . Involuntary commitment 11/02/2015  . Sedative, hypnotic or anxiolytic use disorder, severe, dependence (HCC) 11/02/2015  . Pulmonary embolism (HCC) 11/01/2015  . Opioid use disorder, severe, in early remission, on maintenance therapy 10/03/2015  . Tobacco use disorder 10/03/2015  . Migraine 10/03/2015    Past Surgical History  Procedure Laterality Date  . Shoulder surgery    . Cesarean section      Allergies Vancomycin and Narcan  Social History Social History  Substance Use Topics  . Smoking status: Current Every Day Smoker -- 1.00 packs/day    Types: Cigarettes  . Smokeless tobacco: None  . Alcohol Use: Yes    Review of Systems Constitutional: Negative for fever. Cardiovascular: Negative for chest pain. Respiratory: Negative for  shortness of breath. Gastrointestinal: Negative for abdominal pain, vomiting and diarrhea. Genitourinary: Negative for dysuria. Musculoskeletal:Positive for diffuse pain Skin: Negative for rash. Neurological: Positive for weakness  ____________________________________________   PHYSICAL EXAM:  VITAL SIGNS: ED Triage Vitals  Enc Vitals Group     BP 12/17/15 1107 135/85 mmHg     Pulse Rate 12/17/15 1107 63     Resp 12/17/15 1107 15     Temp 12/17/15 1107 97.7 F (36.5 C)     Temp Source 12/17/15 1107 Axillary     SpO2 12/17/15 1107 99 %     Weight 12/17/15 1107 250 lb (113.399 kg)     Height 12/17/15 1107  (1.676 m)     Head Cir --      Peak Flow --      Pain Score 12/17/15 1108 10     Pain Loc --      Pain Edu? --      Excl. in GC? --     Constitutional: Alert and oriented, moderate distress Eyes: Conjunctivae are normal. PERRL. Normal extraocular movements. ENT   Head: Normocephalic and atraumatic.   Nose: No congestion/rhinnorhea.   Mouth/Throat: Mucous membranes are moist.   Neck: No stridor. Cardiovascular: Normal rate, regular rhythm. No murmurs, rubs, or gallops. Respiratory: Normal respiratory effort without tachypnea nor retractions. Breath sounds are clear and equal bilaterally. No wheezes/rales/rhonchi. Gastrointestinal: Soft and nontender. Normal bowel sounds Musculoskeletal: Nontender with normal range of motion in all extremities. Numerous track marks over the upper extremities are noted  Neurologic:  Normal speech and language. No gross focal neurologic deficits are appreciated.  Skin:  Skin is warm, dry. Old track marks and injection sites are noted Psychiatric: Anxious and agitated mood and affect. ____________________________________________  EKG: Interpreted by me.Normal sinus rhythm with a rate of 68 bpm, normal PR interval, normal QRS, normal QT interval. Normal axis.  ____________________________________________  ED  COURSE:  Pertinent labs & imaging results that were available during my care of the patient were reviewed by me and considered in my medical decision making (see chart for details). Patient presents to the ER after she has stopped taking fentanyl and she was ordering over the Internet. She states last use was yesterday. She denies suicidal homicidal ideations. Peripheral ultrasound was placed by me. ____________________________________________    LABS (pertinent positives/negatives)  Labs Reviewed  CBC WITH DIFFERENTIAL/PLATELET - Abnormal; Notable for the following:    RBC 5.44 (*)    RDW 15.7 (*)    Neutro Abs 7.1 (*)    All other components within normal limits  COMPREHENSIVE METABOLIC PANEL - Abnormal; Notable for the following:    Sodium 133 (*)    Glucose, Bld 121 (*)    ALT 12 (*)    All other components within normal limits  ACETAMINOPHEN LEVEL - Abnormal; Notable for the following:    Acetaminophen (Tylenol), Serum <10 (*)    All other components within normal limits  URINE DRUG SCREEN, QUALITATIVE ([facility] ONLY)  ETHANOL  SALICYLATE LEVEL   ____________________________________________  FINAL ASSESSMENT AND PLAN  Narcotic withdrawal  Plan: Patient with labs and imaging as dictated above. Patient presents to the ER with severe narcotic withdrawal. She is high risk for adverse outcome due to her complicated medical history. We will attempt to place her into a detox facility. She is medically stable at this time.  After repeated attempts we were unable to place her into a detox facility. She's been given clonidine and Flexeril to try to ease her withdrawal symptoms. She is stable for discharge. Emily FilbertWilliams, Jonathan E, MD   Note: This dictation was prepared with Dragon dictation. Any transcriptional errors that result from this process are unintentional   Emily FilbertJonathan E Williams, MD 12/17/15 1334  Emily FilbertJonathan E Williams, MD 12/18/15 [phone]

## 2015-12-17 NOTE — ED Notes (Signed)
Patient ripped off all leads, BP cuff and pulse ox. MD made aware. Patient refusing vitals to be taken at this time.

## 2015-12-17 NOTE — ED Notes (Addendum)
Patient voided via bedpan. Patient thrashing in bed. Soiled linen. Bed sheets changed. Warm blanket given. Patient refusing this RN to take BP. Patient taken off O2 due to SpO2 99%. Patient tolerating well.

## 2015-12-17 NOTE — ED Notes (Signed)
Patient refusing to have BP taken at this time.

## 2015-12-17 NOTE — ED Notes (Signed)
Patient is ripping off BP cuff. Patient states, "I cant do this. Everything hurts". Patient reoriented.

## 2015-12-17 NOTE — BH Assessment (Signed)
Writer discussed with the patient about the option of [facility]. Patient was in the agreement with the plan. Patient signed the Release of information, in order to send labs and ER Note. Information sent to [facility] via fax and confirmed it was received.   Received phone call back from [facility] ([phone]), patient was declined.

## 2015-12-17 NOTE — ED Notes (Addendum)
In to ED via EMS shouting out and flailing in the bed.  Pt states she uses intravenous "Chinese Fentanyl" and hasnt used since yesterday at 3pm.  Pt reports pain "in my veins" and all over her body.  Pt has been to ED multiple times in the past for substance abuse.

## 2016-01-07 ENCOUNTER — Other Ambulatory Visit
Admission: EM | Admit: 2016-01-07 | Discharge: 2016-01-07 | Disposition: A | Discharge status: Home / Self Care | Attending: Family Medicine | Admitting: Family Medicine

## 2016-07-16 ENCOUNTER — Other Ambulatory Visit: Payer: Self-pay | Admitting: Nurse Practitioner

## 2016-07-16 ENCOUNTER — Other Ambulatory Visit: Payer: Self-pay | Admitting: Internal Medicine

## 2016-07-16 DIAGNOSIS — M6281 Muscle weakness (generalized): Secondary | ICD-10-CM

## 2016-08-03 ENCOUNTER — Ambulatory Visit: Payer: Self-pay

## 2016-08-31 ENCOUNTER — Ambulatory Visit
Admission: RE | Admit: 2016-08-31 | Discharge: 2016-08-31 | Disposition: A | Discharge status: Home / Self Care | Payer: Self-pay | Source: Ambulatory Visit | Attending: Nurse Practitioner | Admitting: Nurse Practitioner

## 2016-11-22 ENCOUNTER — Emergency Department: Payer: Self-pay

## 2016-11-22 ENCOUNTER — Emergency Department
Admission: EM | Admit: 2016-11-22 | Discharge: 2016-11-22 | Disposition: A | Discharge status: Home / Self Care | Payer: Self-pay | Attending: Emergency Medicine | Admitting: Emergency Medicine

## 2016-11-22 DIAGNOSIS — Z9114 Patient's other noncompliance with medication regimen: Secondary | ICD-10-CM | POA: Insufficient documentation

## 2016-11-22 DIAGNOSIS — R0602 Shortness of breath: Secondary | ICD-10-CM | POA: Insufficient documentation

## 2016-11-22 DIAGNOSIS — F17210 Nicotine dependence, cigarettes, uncomplicated: Secondary | ICD-10-CM | POA: Insufficient documentation

## 2016-11-22 DIAGNOSIS — E871 Hypo-osmolality and hyponatremia: Secondary | ICD-10-CM | POA: Insufficient documentation

## 2016-11-22 DIAGNOSIS — Z79899 Other long term (current) drug therapy: Secondary | ICD-10-CM | POA: Insufficient documentation

## 2016-11-22 DIAGNOSIS — Z86711 Personal history of pulmonary embolism: Secondary | ICD-10-CM | POA: Insufficient documentation

## 2016-11-22 DIAGNOSIS — Z7901 Long term (current) use of anticoagulants: Secondary | ICD-10-CM | POA: Insufficient documentation

## 2016-11-22 LAB — BASIC METABOLIC PANEL
Anion gap: 6 (ref 5–15)
BUN: 10 mg/dL (ref 6–20)
CHLORIDE: 99 mmol/L — AB (ref 101–111)
CO2: 27 mmol/L (ref 22–32)
CREATININE: 0.77 mg/dL (ref 0.44–1.00)
Calcium: 8.8 mg/dL — ABNORMAL LOW (ref 8.9–10.3)
GFR calc non Af Amer: >60 mL/min (ref >60)
Glucose, Bld: 116 mg/dL — ABNORMAL HIGH (ref 65–99)
Potassium: 4 mmol/L (ref 3.5–5.1)
SODIUM: 132 mmol/L — AB (ref 135–145)

## 2016-11-22 LAB — HEPATIC FUNCTION PANEL
ALK PHOS: 85 U/L (ref 38–126)
ALT: 19 U/L (ref 14–54)
AST: 25 U/L (ref 15–41)
Albumin: 3.3 g/dL — ABNORMAL LOW (ref 3.5–5.0)
BILIRUBIN TOTAL: 0.5 mg/dL (ref 0.3–1.2)
Total Protein: 7.5 g/dL (ref 6.5–8.1)

## 2016-11-22 LAB — CBC
HCT: 40.2 % (ref 35.0–47.0)
Hemoglobin: 13.1 g/dL (ref 12.0–16.0)
MCH: 26.3 pg (ref 26.0–34.0)
MCHC: 32.6 g/dL (ref 32.0–36.0)
MCV: 80.7 fL (ref 80.0–100.0)
PLATELETS: 283 10*3/uL (ref 150–440)
RBC: 4.98 MIL/uL (ref 3.80–5.20)
RDW: 17.6 % — AB (ref 11.5–14.5)
WBC: 6.5 10*3/uL (ref 3.6–11.0)

## 2016-11-22 LAB — BRAIN NATRIURETIC PEPTIDE: B Natriuretic Peptide: 183 pg/mL — ABNORMAL HIGH (ref 0.0–100.0)

## 2016-11-22 LAB — TROPONIN I

## 2016-11-22 LAB — FIBRIN DERIVATIVES D-DIMER (ARMC ONLY): Fibrin derivatives D-dimer (ARMC): 1702.18 — ABNORMAL HIGH (ref 0.00–499.00)

## 2016-11-22 MED ORDER — IPRATROPIUM-ALBUTEROL 0.5-2.5 (3) MG/3ML IN SOLN
3.0000 mL | Freq: Once | RESPIRATORY_TRACT | Status: AC
  Administered 2016-11-22: 3 mL via RESPIRATORY_TRACT
  Filled 2016-11-22: qty 3

## 2016-11-22 MED ORDER — ENOXAPARIN SODIUM 150 MG/ML ~~LOC~~ SOLN
1.0000 mg/kg | Freq: Once | SUBCUTANEOUS | Status: AC
  Administered 2016-11-22: 160 mg via SUBCUTANEOUS
  Filled 2016-11-22: qty 1.06

## 2016-11-22 MED ORDER — IOPAMIDOL (ISOVUE-370) INJECTION 76%
100.0000 mL | Freq: Once | INTRAVENOUS | Status: AC | PRN
  Administered 2016-11-22: 100 mL via INTRAVENOUS

## 2016-11-22 NOTE — ED Provider Notes (Signed)
[facility] Emergency Department Provider Note  ____________________________________________   First MD Initiated Contact with Patient 11/22/16 1403     (approximate)  I have reviewed the triage vital signs and the nursing notes.   HISTORY  Chief Complaint Leg Swelling  HPI Courtney Hebert is a 41 y.o. female who self presents to the emergency department with about a week of bilateral lower extremity swelling and shortness of breath. She is a past medical history of multiple deep vein thrombosis as well as pulmonary embolism and reports that she is supposed to be taking Plavix for her anticoagulation. She has been noncompliant with her Plavix because she found to give her heavy periods. He denies chest pain. She denies abdominal pain nausea or vomiting. She has no history of congestive heart failure.   Past Medical History:  Diagnosis Date  . Anxiety   . Bipolar 1 disorder (HCC)   . Chronic back pain   . DVT (deep venous thrombosis) (HCC)   . Seizures (HCC)   . Sleep apnea   . Vascular disease     Patient Active Problem List   Diagnosis Date Noted  . Bipolar I disorder, most recent episode depressed, severe without psychotic features (HCC) 11/04/2015  . Opioid overdose 11/04/2015  . Suicidal ideation 11/02/2015  . Involuntary commitment 11/02/2015  . Sedative, hypnotic or anxiolytic use disorder, severe, dependence (HCC) 11/02/2015  . Pulmonary embolism (HCC) 11/01/2015  . Opioid use disorder, severe, in early remission, on maintenance therapy (HCC) 10/03/2015  . Tobacco use disorder 10/03/2015  . Migraine 10/03/2015    Past Surgical History:  Procedure Laterality Date  . CESAREAN SECTION    . SHOULDER SURGERY      Prior to Admission medications   Medication Sig Start Date End Date Taking? Authorizing Provider  cloNIDine (CATAPRES - DOSED IN MG/24 HR) 0.1 mg/24hr patch Place 1 patch (0.1 mg total) onto the skin every 7 (seven) days. 12/17/15    Emily Filbert, MD  cyclobenzaprine (FLEXERIL) 10 MG tablet Take 1 tablet (10 mg total) by mouth 3 (three) times daily as needed for muscle spasms. 12/17/15   Emily Filbert, MD  lithium carbonate 300 MG capsule Take 1 capsule (300 mg total) by mouth daily. 11/04/15   Pucilowska, Jolanta B, MD  ondansetron (ZOFRAN) 4 MG tablet Take 1 tablet (4 mg total) by mouth every 8 (eight) hours as needed for nausea or vomiting. 10/08/15   Houston Siren, MD  pantoprazole (PROTONIX) 20 MG tablet Take 1 tablet (20 mg total) by mouth daily. 11/20/15 11/19/16  Jennye Moccasin, MD  rivaroxaban (XARELTO) 20 MG TABS tablet Take 1 tablet (20 mg total) by mouth daily. 11/23/15   Pucilowska, Jolanta B, MD  tiZANidine (ZANAFLEX) 2 MG tablet Take 2 mg by mouth 2 (two) times daily.    [provider]    Allergies Vancomycin and Narcan [naloxone hcl]  History reviewed. No pertinent family history.  Social History Social History  Substance Use Topics  . Smoking status: Current Every Day Smoker    Packs/day: 1.00    Types: Cigarettes  . Smokeless tobacco: Not on file  . Alcohol use Yes    Review of Systems Constitutional: No fever/chills Eyes: No visual changes. ENT: No sore throat. Cardiovascular: Denies chest pain. Respiratory: Positive shortness of breath. Gastrointestinal: No abdominal pain.  No nausea, no vomiting.  No diarrhea.  No constipation. Genitourinary: Negative for dysuria. Musculoskeletal: Positive for leg swelling Skin: Negative for  rash. Neurological: Negative for headaches, focal weakness or numbness.   ____________________________________________   PHYSICAL EXAM:  VITAL SIGNS: ED Triage Vitals  Enc Vitals Group     BP 11/22/16 1354 129/61     Pulse Rate 11/22/16 1206 81     Resp 11/22/16 1206 (!) 22     Temp 11/22/16 1206 98.1 F (36.7 C)     Temp Source 11/22/16 1206 Oral     SpO2 11/22/16 1206 92 %     Weight 11/22/16 1207 (!) 350 lb (158.8 kg)      Height 11/22/16 1207 5\' 7"  (1.702 m)     Head Circumference --      Peak Flow --      Pain Score 11/22/16 1206 6     Pain Loc --      Pain Edu? --      Excl. in GC? --     Constitutional: Alert and oriented 4 morbidly obese and appears somewhat short of breath Eyes: PERRL EOMI. Head: Atraumatic. Nose: No congestion/rhinnorhea. Mouth/Throat: No trismus Neck: No stridor.   Cardiovascular: Normal rate, regular rhythm. Grossly normal heart sounds.  Good peripheral circulation. Able to lie completely flat Respiratory: Taking somewhat shallow rapid breaths mild wheezing throughout Gastrointestinal: Obese nontender Musculoskeletal: 2+ pitting edema to mid shin bilaterally legs equal in size   Neurologic:  Normal speech and language. No gross focal neurologic deficits are appreciated. Skin:  Skin is warm, dry and intact. No rash noted. Psychiatric: Mood and affect are normal. Speech and behavior are normal.    ____________________________________________   DIFFERENTIAL includes but not limited to  Pulmonary embolism, deep vein thrombosis, cirrhosis, congestive heart failure, venous stasis ____________________________________________   LABS (all labs ordered are listed, but only abnormal results are displayed)  Labs Reviewed  BASIC METABOLIC PANEL - Abnormal; Notable for the following:       Result Value   Sodium 132 (*)    Chloride 99 (*)    Glucose, Bld 116 (*)    Calcium 8.8 (*)    All other components within normal limits  CBC - Abnormal; Notable for the following:    RDW 17.6 (*)    All other components within normal limits  FIBRIN DERIVATIVES D-DIMER ([facility] ONLY) - Abnormal; Notable for the following:    Fibrin derivatives D-dimer [facility] 1,702.18 (*)    All other components within normal limits  BRAIN NATRIURETIC PEPTIDE - Abnormal; Notable for the following:    B Natriuretic Peptide 183.0 (*)    All other components within normal limits  HEPATIC FUNCTION PANEL -  Abnormal; Notable for the following:    Albumin 3.3 (*)    Bilirubin, Direct <0.1 (*)    All other components within normal limits  TROPONIN I  URINALYSIS, COMPLETE (UACMP) WITH MICROSCOPIC    No signs of acute ischemia slightly elevated BNP is nonspecific elevated d-dimer suggestive of possible clot __________________________________________  EKG   ____________________________________________  RADIOLOGY  Imaging is pending   PROCEDURES  Procedure(s) performed: yes  Angiocath insertion Performed by: Merrily BrittleNeil Adib Wahba  Consent: Verbal consent obtained. Risks and benefits: risks, benefits and alternatives were discussed Time out: Immediately prior to procedure a "time out" was called to verify the correct patient, procedure, equipment, support staff and site/side marked as required.  Preparation: Patient was prepped and draped in the usual sterile fashion.  Vein Location: Left basilic vein  Ultrasound Guided  Gauge: 20  Normal blood return and flush without difficulty Patient tolerance: Patient  tolerated the procedure well with no immediate complications.     Procedures  Critical Care performed: no  Observation: no ____________________________________________   INITIAL IMPRESSION / ASSESSMENT AND PLAN / ED COURSE  Pertinent labs & imaging results that were available during my care of the patient were reviewed by me and considered in my medical decision making (see chart for details).  The patient arrives with leg swelling and shortness of breath. She has multiple reasons she could be short of breath including morbid obesity as well as pulmonary embolism given her medication noncompliance.  Ultrasound of her bilateral lower extremities as well as a CT angiogram of her chest are pending. Lovenox in the meantime.      ____________________________________________   FINAL CLINICAL IMPRESSION(S) / ED DIAGNOSES  Final diagnoses:  None      NEW  MEDICATIONS STARTED DURING THIS VISIT:  New Prescriptions   No medications on file     Note:  This document was prepared using Dragon voice recognition software and may include unintentional dictation errors.     Merrily Brittle, MD 11/22/16 [phone]

## 2016-11-22 NOTE — ED Notes (Signed)
Pt not in room. Pt still has IV documents and no nurse reports removal of IV. Pt called and told to come back to have documented record of IV removal.

## 2016-11-22 NOTE — ED Notes (Signed)
Pt called out stating she wanted her IV removed. This nurse in other pt's room, asked float nurse to check on pt.

## 2016-11-22 NOTE — ED Provider Notes (Addendum)
This patient was signed out to me by Dr. Jacques EarthlyNeil Rifenark. 41 year old female with a history of PE, currently off of her anticoagulation, also with morbid obesity, presenting for shortness of breath. The patient CT angiogram does not show PE.  The patient has a negative troponin. She does have a BNP of 183, but no evidence of pulmonary edema; this can be followed up by her primary care physician. I'm awaiting the results of her bilateral lower extremity ultrasounds at this time.  On my examination, the patient is resting comfortably and has normal vital signs including a normal O2 saturation on room air. She does have a mildly hoarse voice, so question whether she had this may be a viral process.  If the patient's ultrasounds of the legs are normal, I'll plan to discharge her home. She does have a primary care physician with whom she has a close relationship and can see that person this week for follow-up. Return precautions were discussed.  ED ECG REPORT I, Rockne MenghiniNorman, Anne-Caroline, the attending physician, personally viewed and interpreted this ECG.   Date: 11/22/2016  EKG Time: 1651  Rate: 76  Rhythm: normal sinus rhythm  Axis: normal  Intervals:none  ST&T Change: No STEMI    Rockne MenghiniNorman, Anne-Caroline, MD 11/22/16 1705    Rockne MenghiniNorman, Anne-Caroline, MD 11/22/16 [phone]

## 2016-11-22 NOTE — ED Triage Notes (Signed)
Pt states bilat leg swelling. States takes lasix., hx DVT a year ago. Supposed to be taking plavix but hasn't been on it for 2 weeks. Pt states SOB. Alert, oriented, ambulatory.

## 2016-11-22 NOTE — ED Notes (Signed)
Pt resting in bed, monitor placed, ekg obtained. Sats 93-95% on RA, expiratory wheezing noted.

## 2016-11-22 NOTE — Discharge Instructions (Addendum)
Please talk to your doctor about whether you need to be on a blood thinner for your history of blood clots in the lungs. Please discuss your concerns about your heavy periods.  Please also have your doctor recheck your sodium level, which was slightly low today.  Return to the emergency department if you develop severe pain, chest pain, shortness of breath, lightheadedness or fainting, fever, or any other symptoms concerning to you.

## 2016-11-22 NOTE — ED Notes (Signed)
Float nurse asked to discharge pt.

## 2016-11-22 NOTE — ED Notes (Signed)
Pt returned to  Show staff that she pulled out her IV herself because no one came to the room to take it out and she waited for an and a half. I explained to pt that her primary RN was with a critical pt during that time, but pt stated " I don't care who she was with".  IV site clean and clear per this RN.

## 2016-11-22 NOTE — ED Notes (Signed)
Patient transported to CT 

## 2017-02-07 IMAGING — CR DG CHEST 2V
1 series · 2 of 2 positions shown · non-contrast
Comparison: 11/01/2015

CLINICAL DATA: Chest pain

EXAM:
CHEST  2 VIEW

[Series 1: dg chest 2 view · 0.14mm/px · 2 of 2 slices shown]
[im 1/2]
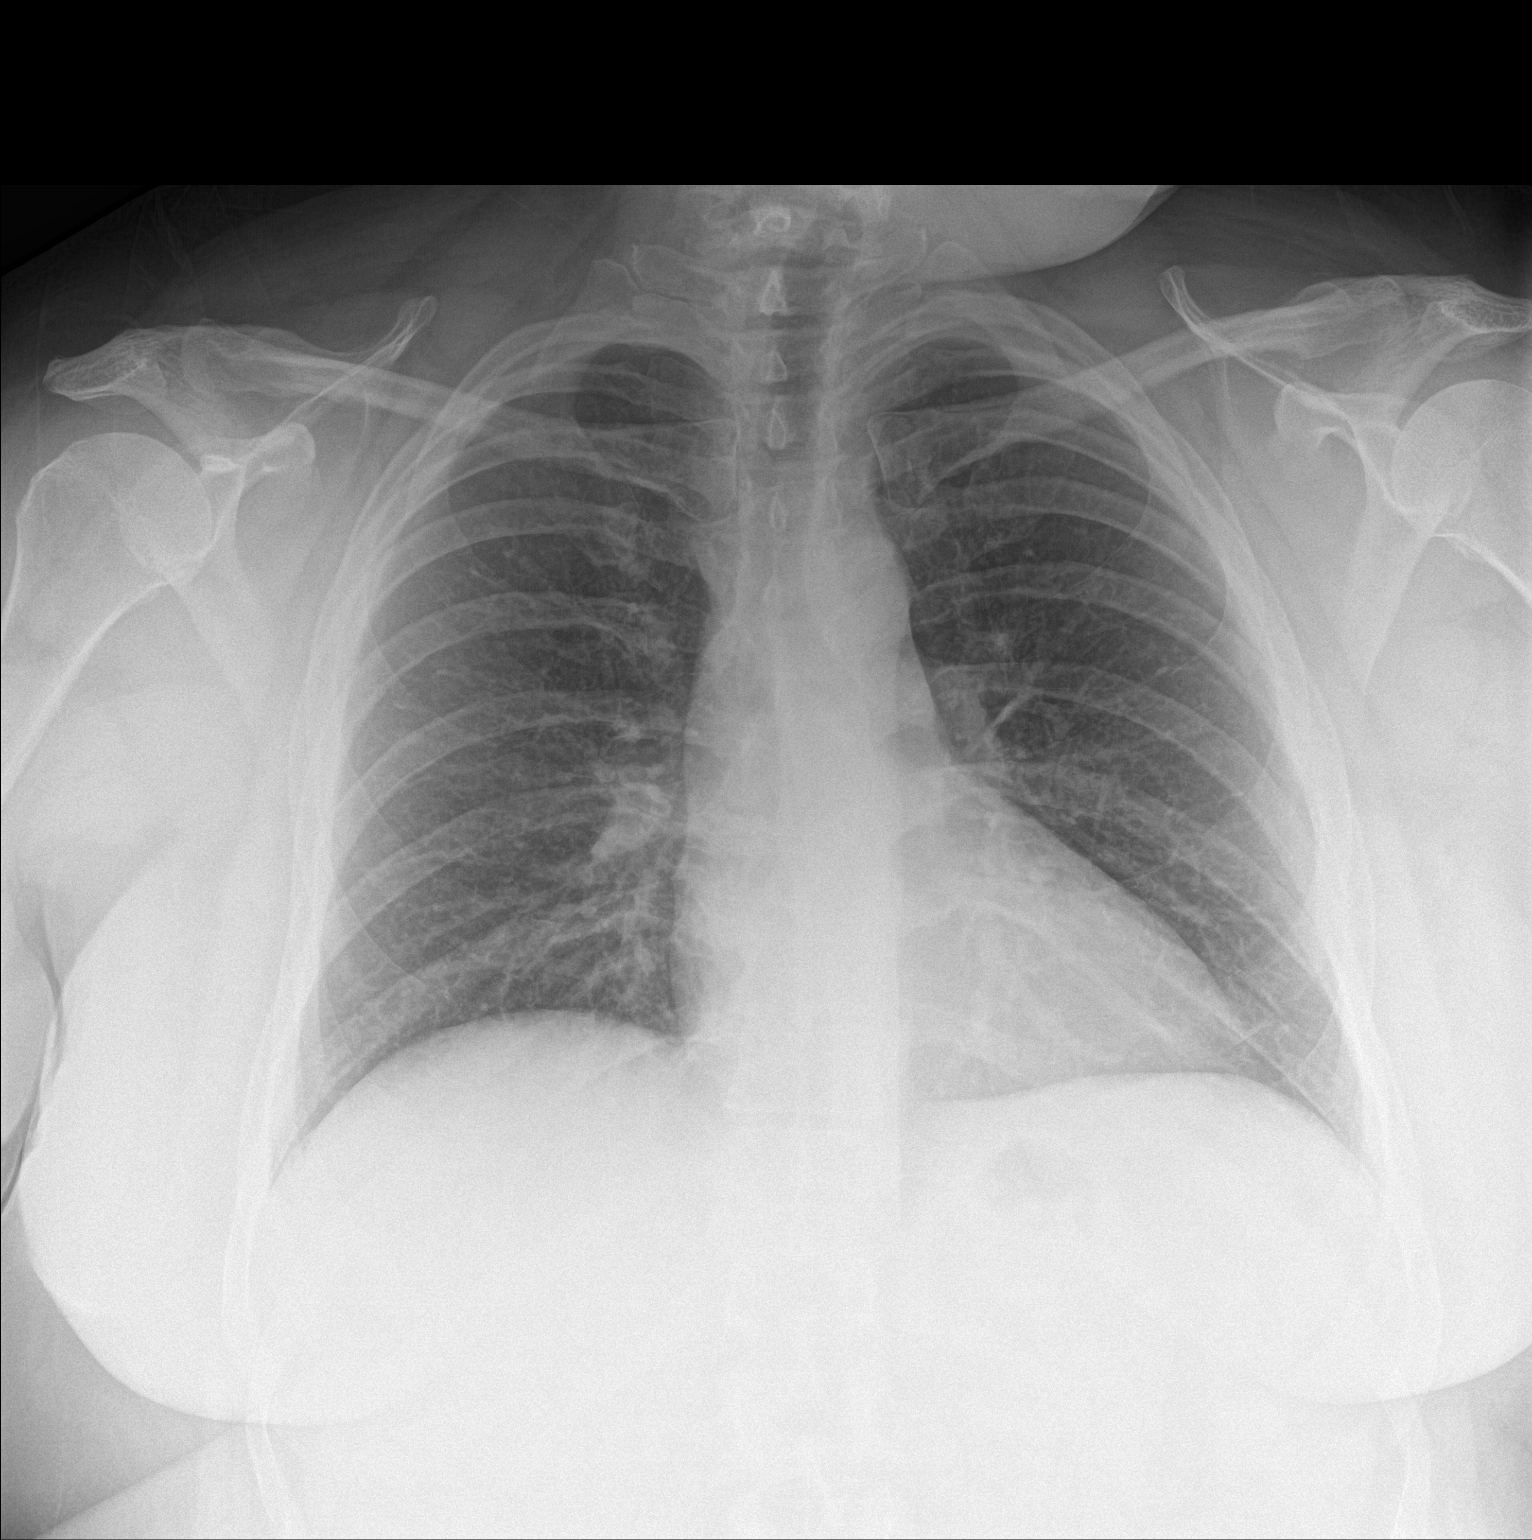
[im 2/2]
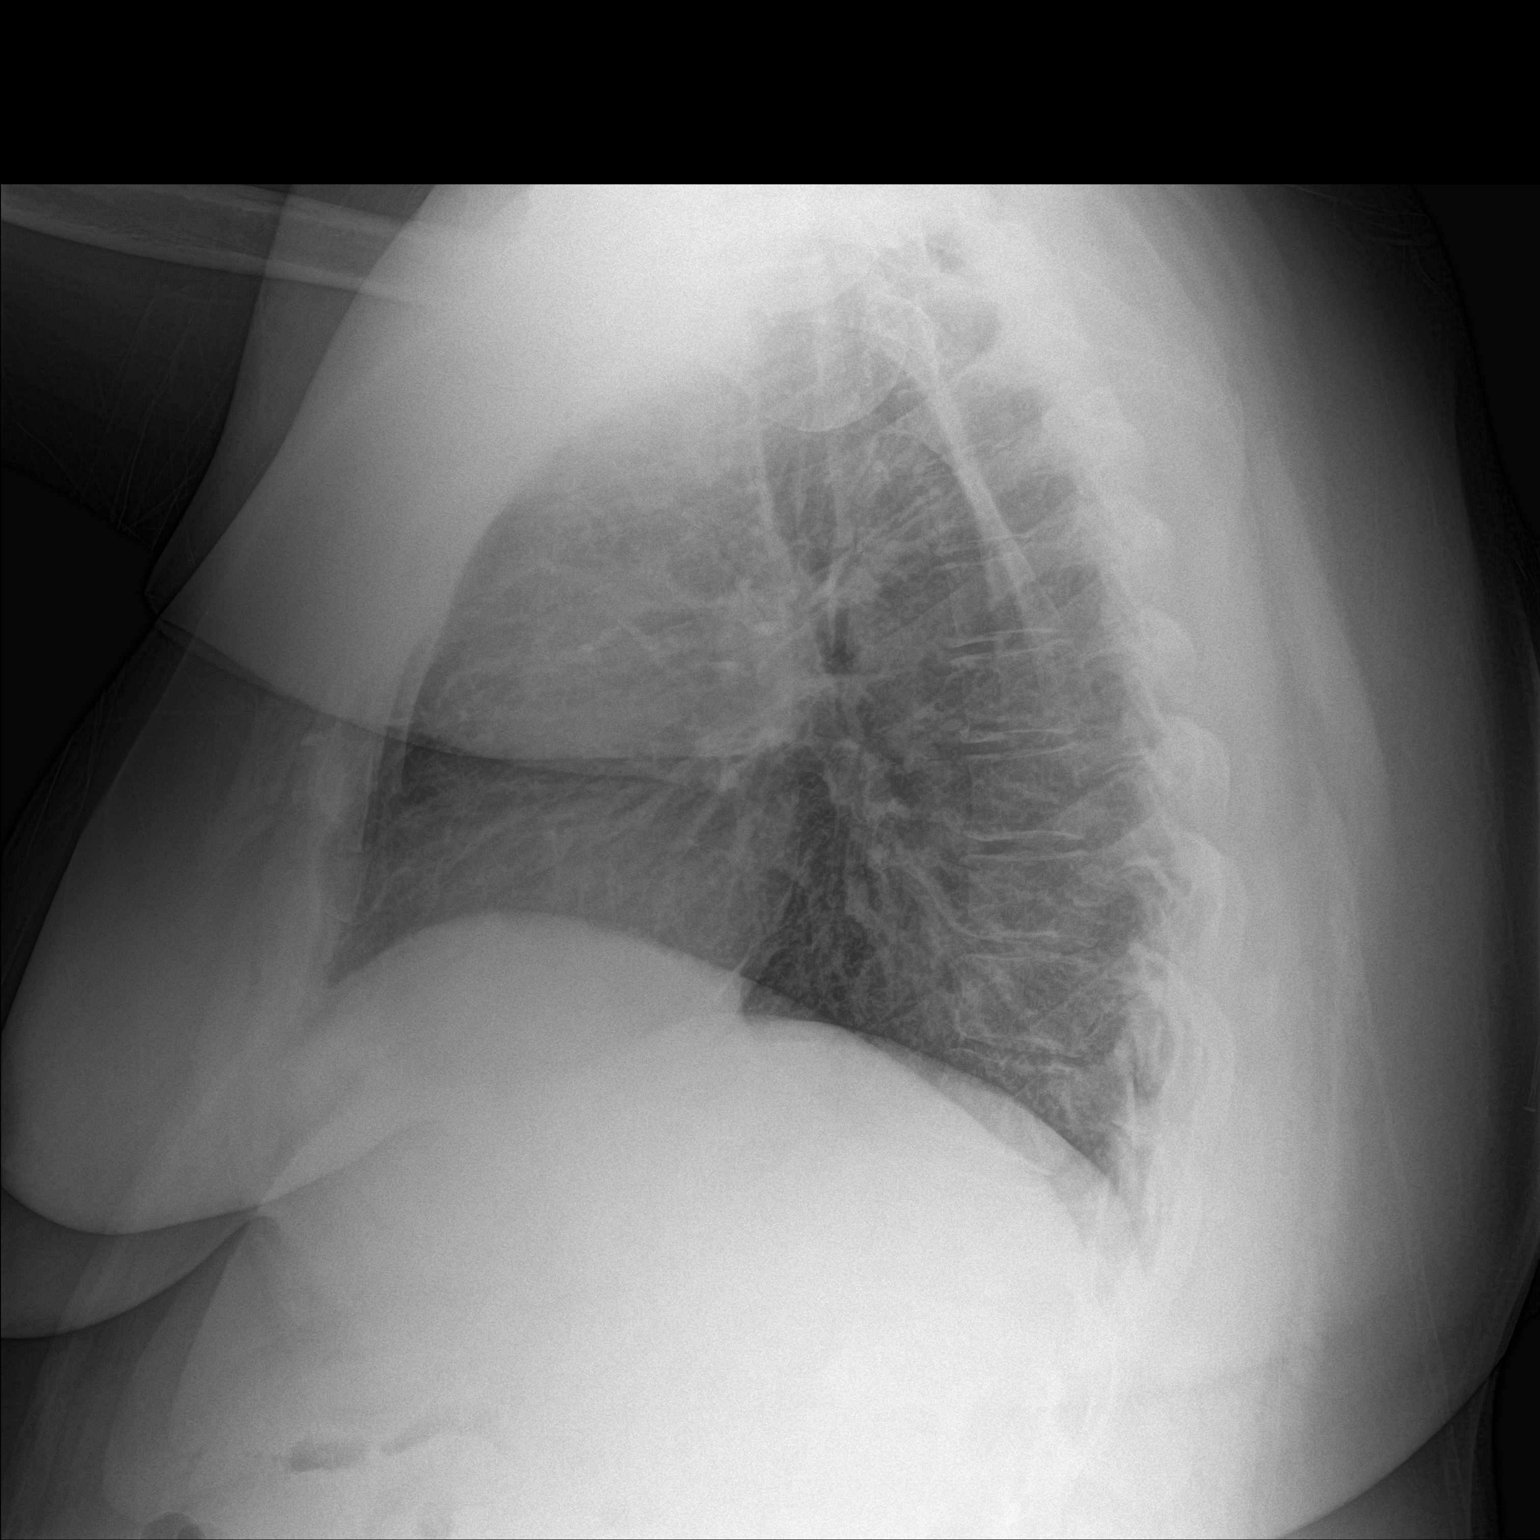

[2 of 2 positions shown; findings below may reference images not displayed]

FINDINGS: The heart size and mediastinal contours are within normal limits.
Both lungs are clear. The visualized skeletal structures are
unremarkable.
IMPRESSION: No active cardiopulmonary disease.

## 2017-02-07 IMAGING — CT CT ANGIO CHEST
2 of 6 series · 19 of 46 positions shown · IV contrast (APPLIED)
Comparison: CT chest 11/01/2015. PA and lateral chest earlier
today.

CLINICAL DATA: Anterior right chest pain for 3 days. History of
prior pulmonary embolus.

EXAM:
CT ANGIOGRAPHY CHEST WITH CONTRAST
TECHNIQUE: Multidetector CT imaging of the chest was performed using the
standard protocol during bolus administration of intravenous
contrast. Multiplanar CT image reconstructions and MIPs were
obtained to evaluate the vascular anatomy.
CONTRAST:  100 cc Isovue 370.

[Series 5: pe thins 1.5 · axial · 0.68mm/px · z∈[-538,-272]mm · 16 of 250 slices shown]
[im 14/250  lung]
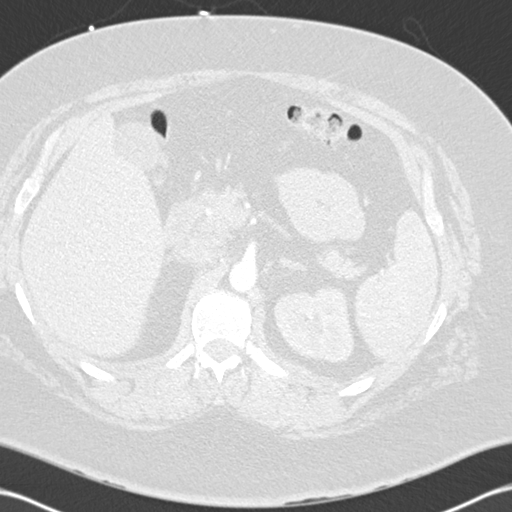
[im 27/250  soft-tissue]
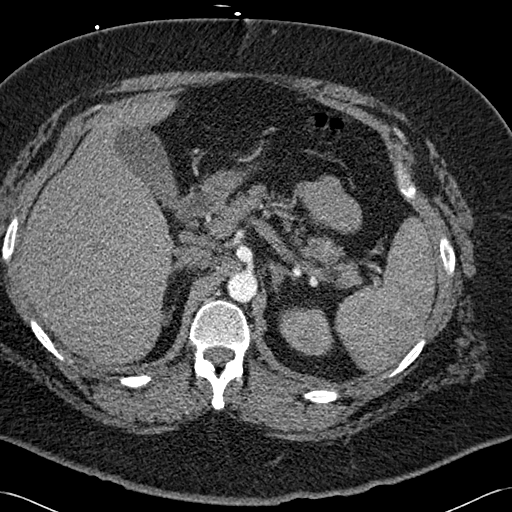
[im 40/250  lung]
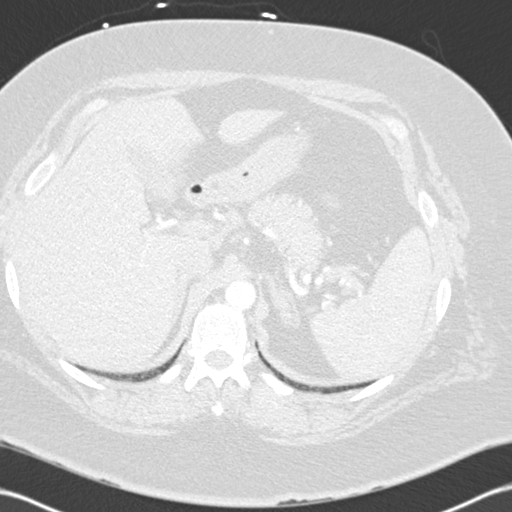
[im 53/250  soft-tissue]
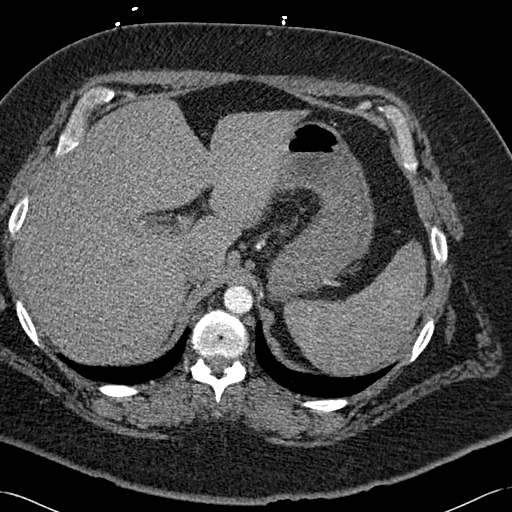
[im 79/250  lung]
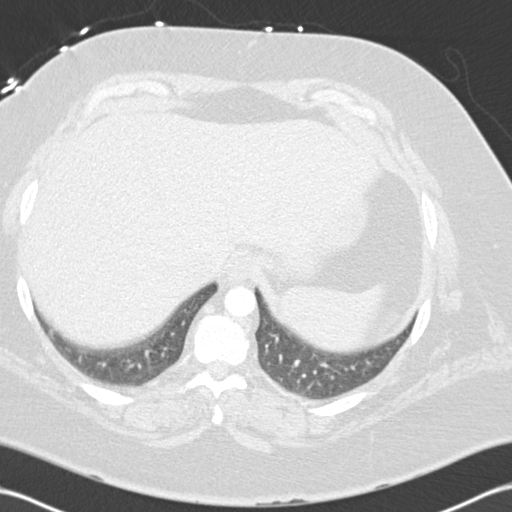
[im 92/250  soft-tissue]
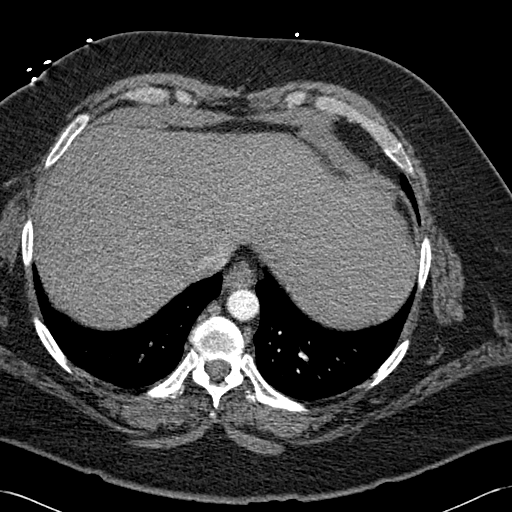
[im 105/250  lung]
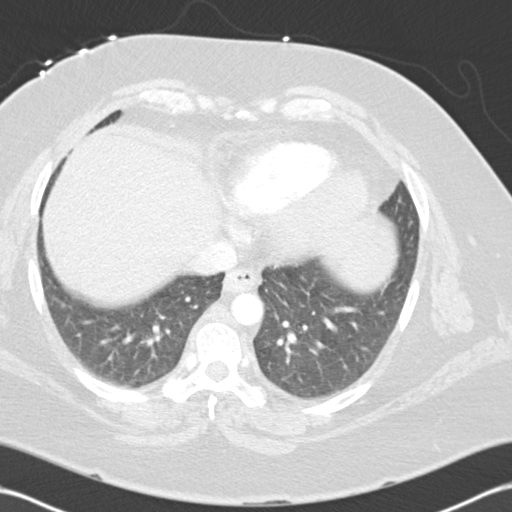
[im 118/250  soft-tissue]
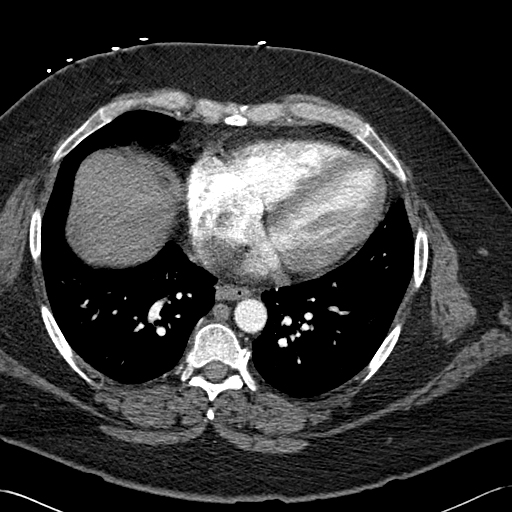
[im 132/250  lung]
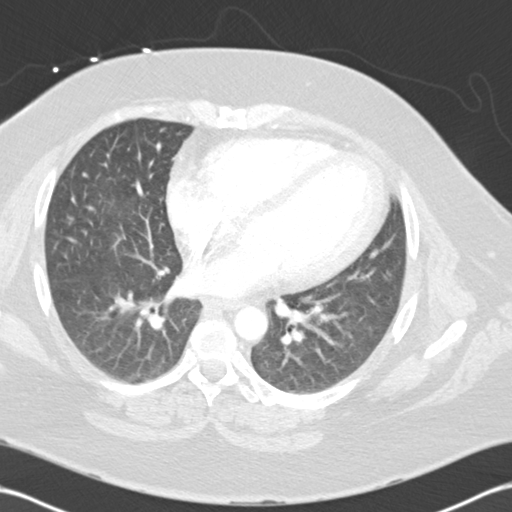
[im 145/250  soft-tissue]
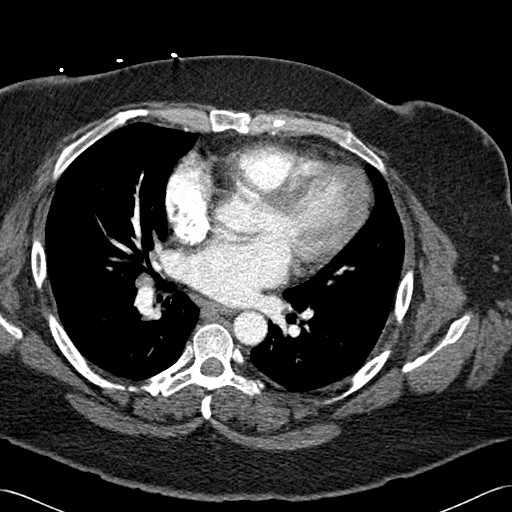
[im 158/250  lung]
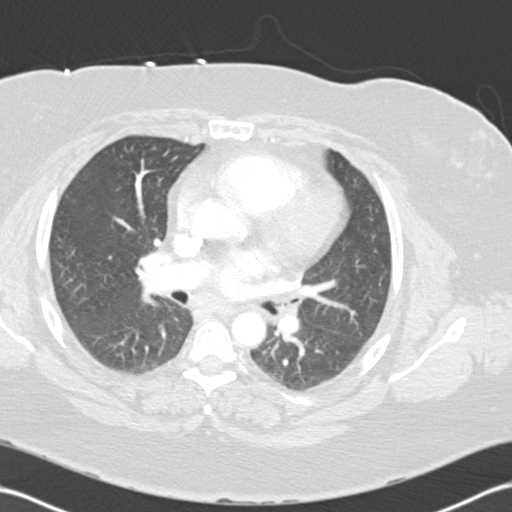
[im 171/250  soft-tissue]
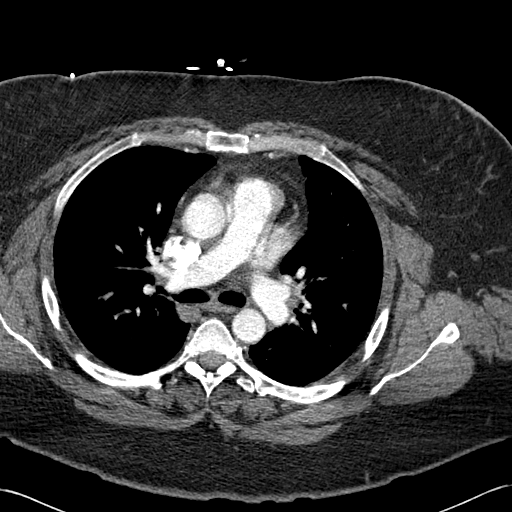
[im 197/250  lung]
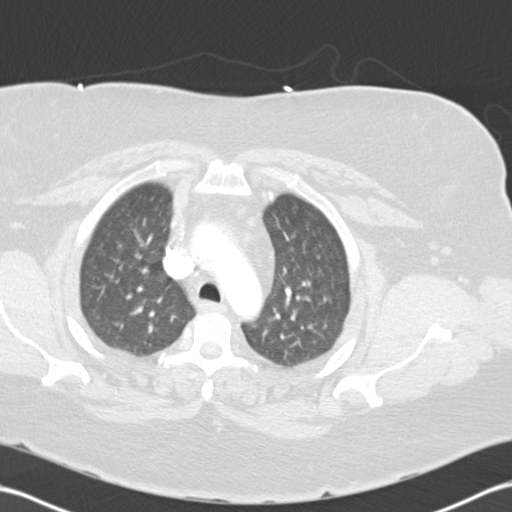
[im 210/250  soft-tissue]
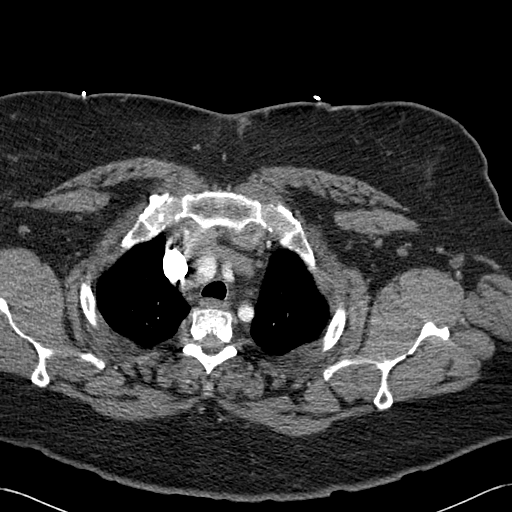
[im 223/250  lung]
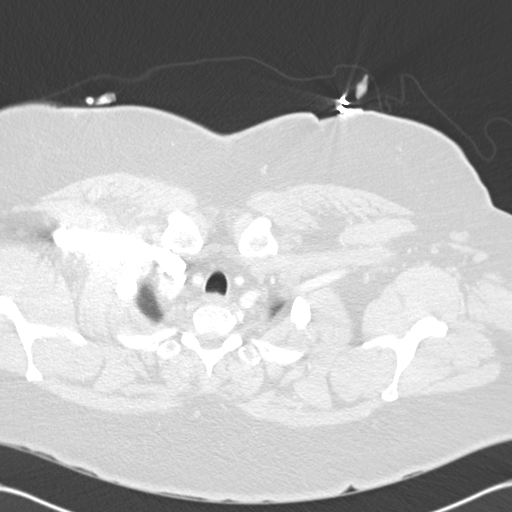
[im 236/250  soft-tissue]
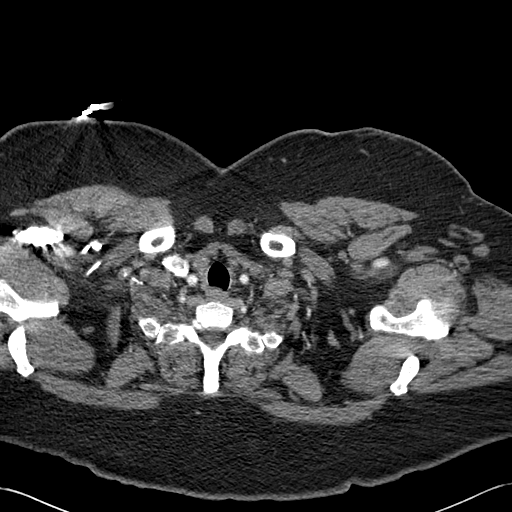

[Series 7: cor mpr 2.0 · coronal · 0.61mm/px · 3 of 136 slices shown]
[im 34/136  soft-tissue]
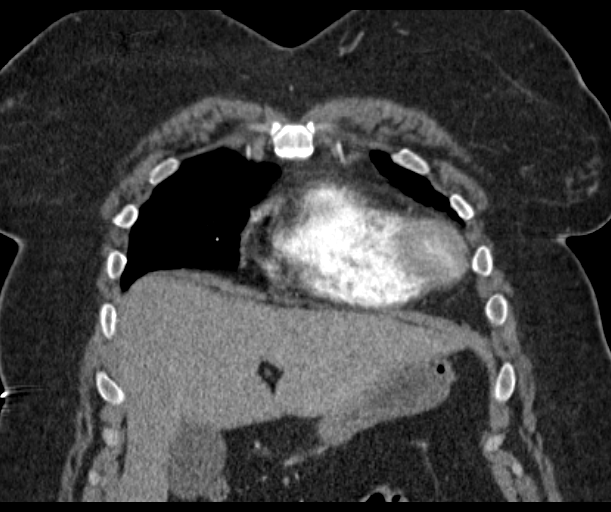
[im 68/136  soft-tissue]
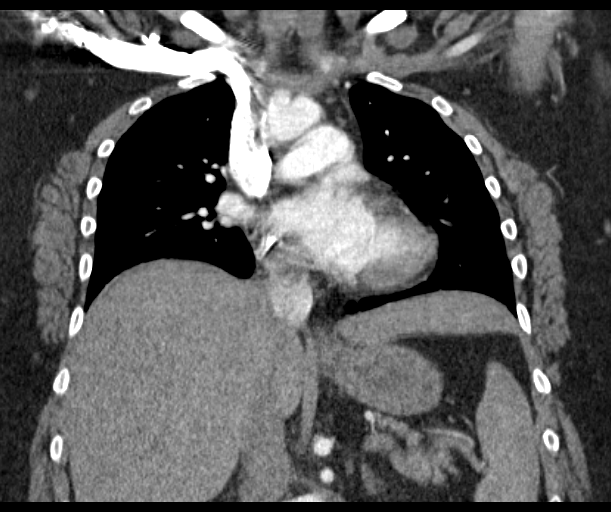
[im 102/136  soft-tissue]
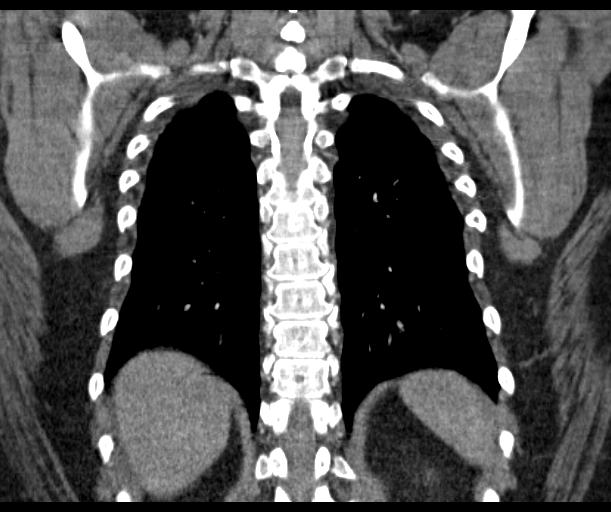

[19 of 46 positions shown; findings below may reference images not displayed]

FINDINGS: No pulmonary embolus is identified. Heart size is mildly enlarged.
No pleural or pericardial effusion. No axillary, hilar or
mediastinal lymphadenopathy. The lungs are clear imaged upper
abdomen is unremarkable. No focal bony abnormality is identified.

Review of the MIP images confirms the above findings.
IMPRESSION: Negative for pulmonary embolus. Emboli in the left lower and right
upper lobe seen on the patient's comparison CT scan proximal 2 weeks
ago are no longer visualized. No acute disease.

Cardiomegaly.

## 2017-04-27 ENCOUNTER — Other Ambulatory Visit: Payer: Self-pay | Admitting: Nurse Practitioner

## 2017-04-27 DIAGNOSIS — N6314 Unspecified lump in the right breast, lower inner quadrant: Secondary | ICD-10-CM

## 2017-05-19 ENCOUNTER — Other Ambulatory Visit: Payer: Self-pay

## 2017-05-19 MED ORDER — DOXYCYCLINE HYCLATE 100 MG PO CAPS: 100.0000 mg | ORAL_CAPSULE | Freq: Two times a day (BID) | ORAL | 0 refills | Status: AC

## 2017-06-06 ENCOUNTER — Other Ambulatory Visit: Payer: Self-pay

## 2017-06-20 ENCOUNTER — Ambulatory Visit: Payer: Self-pay | Admitting: Nurse Practitioner

## 2017-06-20 ENCOUNTER — Other Ambulatory Visit: Payer: Self-pay

## 2017-06-20 MED ORDER — TIZANIDINE HCL 2 MG PO TABS: 2.0000 mg | ORAL_TABLET | Freq: Two times a day (BID) | ORAL | 0 refills | Status: DC

## 2017-07-20 ENCOUNTER — Other Ambulatory Visit: Payer: Self-pay

## 2017-07-20 MED ORDER — TIZANIDINE HCL 2 MG PO TABS: 2.0000 mg | ORAL_TABLET | Freq: Two times a day (BID) | ORAL | 1 refills | Status: AC

## 2017-08-29 ENCOUNTER — Ambulatory Visit: Payer: 59 | Admitting: Obstetrics and Gynecology

## 2017-08-29 ENCOUNTER — Encounter: Payer: Self-pay | Admitting: Obstetrics and Gynecology

## 2017-08-29 VITALS — BP 144/88 | HR 105 | Ht 67.0 in | Wt 347.0 lb

## 2017-08-29 DIAGNOSIS — Z124 Encounter for screening for malignant neoplasm of cervix: Secondary | ICD-10-CM

## 2017-08-29 DIAGNOSIS — N939 Abnormal uterine and vaginal bleeding, unspecified: Secondary | ICD-10-CM

## 2017-08-29 NOTE — Progress Notes (Signed)
Gynecology Abnormal Uterine Bleeding Initial Evaluation  PCP: Lyndon CodeKhan, Fozia M, MD  Chief Complaint:  Chief Complaint  Patient presents with  . Metrorrhagia    Heavy bleeding/cramping x few months  . Heavy cycles    History of Present Illness:    Paitient is a 42 y.o. G4P0 who presents today for a problem visit.  She complains of menorrhagia that  began within the past year and its severity is described as severe.  She has regular periods every month and they are associated with moderate menstrual cramping.    While periods are regular monthly duration is longer and heavier.  Does report occasional intermenstrual spotting.  Have gotten slightly better off Xarelto, but continue to affect quality of life.  Now bleeding 2 weeks at a time.  No recent pap smears.    Her past medical history is notable for DVT/PE. She does report a 100lbs weight gain in the past year.  She reports prior menstrual cycles were normal lasting 4-5 days so this represents a fairly significant and acute change.     Review of Systems: Review of Systems  Constitutional: Positive for malaise/fatigue. Negative for chills, diaphoresis, fever and weight loss.  HENT: Negative.   Eyes: Negative.   Respiratory: Negative.   Cardiovascular: Negative.   Gastrointestinal: Negative.   Genitourinary: Negative.   Musculoskeletal: Negative.   Skin: Negative.   Neurological: Positive for tingling. Negative for dizziness, tremors, sensory change, speech change, focal weakness, weakness and headaches.  Psychiatric/Behavioral: Negative.     Past Medical History:  Past Medical History:  Diagnosis Date  . Anxiety   . Bipolar 1 disorder (HCC)   . Chronic back pain   . DVT (deep venous thrombosis) (HCC)   . Seizures (HCC)   . Sleep apnea   . Vascular disease     Past Surgical History:  Past Surgical History:  Procedure Laterality Date  . CESAREAN SECTION  2003  . CESAREAN SECTION  2004  . SHOULDER SURGERY       Obstetric History: G4P0  Family History:  No family history on file.  Social History:  Social History   Socioeconomic History  . Marital status: Married    Spouse name: Not on file  . Number of children: Not on file  . Years of education: Not on file  . Highest education level: Not on file  Occupational History  . Not on file  Social Needs  . Financial resource strain: Not on file  . Food insecurity:    Worry: Not on file    Inability: Not on file  . Transportation needs:    Medical: Not on file    Non-medical: Not on file  Tobacco Use  . Smoking status: Current Every Day Smoker    Packs/day: 1.00    Types: Cigarettes  . Smokeless tobacco: Never Used  Substance and Sexual Activity  . Alcohol use: Never    Frequency: Never  . Drug use: Not Currently    Comment: heroin, fentanyl Analog  . Sexual activity: Yes    Partners: Male    Birth control/protection: None  Lifestyle  . Physical activity:    Days per week: Not on file    Minutes per session: Not on file  . Stress: Not on file  Relationships  . Social connections:    Talks on phone: Not on file    Gets together: Not on file    Attends religious service: Not on file  Active member of club or organization: Not on file    Attends meetings of clubs or organizations: Not on file    Relationship status: Not on file  . Intimate partner violence:    Fear of current or ex partner: Not on file    Emotionally abused: Not on file    Physically abused: Not on file    Forced sexual activity: Not on file  Other Topics Concern  . Not on file  Social History Narrative  . Not on file    Allergies:  Allergies  Allergen Reactions  . Vancomycin Anaphylaxis  . Narcan [Naloxone Hcl] Nausea And Vomiting    Pt reports in the past has had hives as well.    Medications: Prior to Admission medications   Medication Sig Start Date End Date Taking? Authorizing Provider  lithium carbonate 300 MG capsule Take 1 capsule  (300 mg total) by mouth daily. 11/04/15  Yes Pucilowska, Jolanta B, MD  methacholine (PROVOCHOLINE) 100 MG inhaler solution Inhale into the lungs once.   Yes [provider]  tiZANidine (ZANAFLEX) 2 MG tablet Take 1 tablet (2 mg total) by mouth 2 (two) times daily. 07/20/17  Yes Boscia, Kathlynn Grate, NP  cloNIDine (CATAPRES - DOSED IN MG/24 HR) 0.1 mg/24hr patch Place 1 patch (0.1 mg total) onto the skin every 7 (seven) days. Patient not taking: Reported on 08/29/2017 12/17/15   Emily Filbert, MD  cyclobenzaprine (FLEXERIL) 10 MG tablet Take 1 tablet (10 mg total) by mouth 3 (three) times daily as needed for muscle spasms. Patient not taking: Reported on 08/29/2017 12/17/15   Emily Filbert, MD  doxycycline (VIBRAMYCIN) 100 MG capsule Take 1 capsule (100 mg total) by mouth 2 (two) times daily. Patient not taking: Reported on 08/29/2017 05/19/17   Carlean Jews, NP  ondansetron (ZOFRAN) 4 MG tablet Take 1 tablet (4 mg total) by mouth every 8 (eight) hours as needed for nausea or vomiting. Patient not taking: Reported on 08/29/2017 10/08/15   Houston Siren, MD  pantoprazole (PROTONIX) 20 MG tablet Take 1 tablet (20 mg total) by mouth daily. 11/20/15 11/19/16  Jennye Moccasin, MD  rivaroxaban (XARELTO) 20 MG TABS tablet Take 1 tablet (20 mg total) by mouth daily. Patient not taking: Reported on 08/29/2017 11/23/15   Shari Prows, MD    Physical Exam Blood pressure (!) 144/88, pulse (!) 105, height 5\' 7"  (1.702 m), weight (!) 347 lb (157.4 kg).  No LMP recorded.  General: NAD HEENT: normocephalic, anicteric Pulmonary: No increased work of breathing Abdomen: Soft, non-tender, non-distended.  Genitourinary:  External: Normal external female genitalia.  Normal urethral meatus, normal Bartholin's and Skene's glands.    Vagina: Normal vaginal mucosa, no evidence of prolapse.    Cervix: Grossly normal in appearance, mild bleeding present, bright red  Uterus: Non-enlarged, mobile,  normal contour.  No CMT  Adnexa: ovaries non-enlarged, no adnexal masses  Rectal: deferred  Lymphatic: no evidence of inguinal lymphadenopathy Extremities: mild edema, some skin changes consistent with poor lower extremity circulation and hyperpigmentation noted Neurologic: Grossly intact Psychiatric: mood appropriate, affect full  Female chaperone present for pelvic portions of the physical exam  Assessment: 43 y.o. G4P0 with abnormal uterine bleeding  Plan: Problem List Items Addressed This Visit    None    Visit Diagnoses    Abnormal uterine bleeding    -  Primary   Relevant Orders   US PELVIS TRANSVANGINAL NON-OB (TV ONLY)   TSH+Prl+FSH+TestT+LH+DHEA S.Marland KitchenMarland Kitchen  CBC   Cervical cancer screening       Relevant Orders   PapIG, HPV, rfx 16/18      1) Discussed management options for abnormal uterine bleeding including expectant, NSAIDs, tranexamic acid (Lysteda), oral progesterone (Provera, norethindrone, megace), Depo Provera, Levonorgestrel containing IUD, endometrial ablation (Novasure) or hysterectomy as definitive surgical management.  Discussed risks and benefits of each method.   Final management decision will hinge on results of patient's work up and whether an underlying etiology for the patients bleeding symptoms can be discerned.  We will conduct a basic work up examining using the PALM-COIEN classification system.  The role of unopposed estrogen in the development of endometrial hyperplasia or carcinoma is discussed.  The risk of endometrial hyperplasia is linearly correlated with increasing BMI given the production of estrone by adipose tissue. - given history of PE/DVT we discussed that several hormonal management options were unavailable for use given increased risk of DVT/PE associated with their use - Mirena IUD discussed as one potential treatment modality  - Novasure endometrial ablation also discussed  - If basic laboratory work up consider 24-hr urine collection to  evaluate for Cushing's given fairly significant recent weight gain - Pap obtained to rule out cervical pathology for patient symptoms  2) Return in about 1 week (around 09/05/2017) for TVUS gyn follow up (possible endometrail biopsy or Mirena IUD).   Vena Austria, MD, Evern Core [facility], [facility] 08/29/2017, 9:57 AM

## 2017-08-31 LAB — PAPIG, HPV, RFX 16/18
HPV, high-risk: NEGATIVE
PAP SMEAR COMMENT: 0

## 2017-09-01 LAB — CBC
Hematocrit: 41.5 % (ref 34.0–46.6)
Hemoglobin: 13.7 g/dL (ref 11.1–15.9)
MCH: 25.6 pg — ABNORMAL LOW (ref 26.6–33.0)
MCHC: 33 g/dL (ref 31.5–35.7)
MCV: 78 fL — ABNORMAL LOW (ref 79–97)
Platelets: 335 x10E3/uL (ref 150–379)
RBC: 5.35 x10E6/uL — ABNORMAL HIGH (ref 3.77–5.28)
RDW: 17.5 % — ABNORMAL HIGH (ref 12.3–15.4)
WBC: 9.8 x10E3/uL (ref 3.4–10.8)

## 2017-09-01 LAB — TSH+PRL+FSH+TESTT+LH+DHEA S...
17-Hydroxyprogesterone: 23 ng/dL
Androstenedione: 145 ng/dL (ref 41–262)
DHEA-SO4: 157 ug/dL (ref 57.3–279.2)
FSH: 4.5 m[IU]/mL
LH: 5.1 m[IU]/mL
Prolactin: 19 ng/mL (ref 4.8–23.3)
TESTOSTERONE: 34 ng/dL (ref 8–48)
TSH: 2.89 u[IU]/mL (ref 0.450–4.500)
Testosterone, Free: 4.2 pg/mL (ref 0.0–4.2)

## 2017-09-08 ENCOUNTER — Ambulatory Visit (INDEPENDENT_AMBULATORY_CARE_PROVIDER_SITE_OTHER): Payer: 59

## 2017-09-08 ENCOUNTER — Encounter: Payer: Self-pay | Admitting: Obstetrics and Gynecology

## 2017-09-08 ENCOUNTER — Ambulatory Visit (INDEPENDENT_AMBULATORY_CARE_PROVIDER_SITE_OTHER): Payer: 59 | Admitting: Obstetrics and Gynecology

## 2017-09-08 VITALS — BP 140/90 | HR 96 | Wt 340.0 lb

## 2017-09-08 DIAGNOSIS — Z30430 Encounter for insertion of intrauterine contraceptive device: Secondary | ICD-10-CM | POA: Diagnosis not present

## 2017-09-08 DIAGNOSIS — N939 Abnormal uterine and vaginal bleeding, unspecified: Secondary | ICD-10-CM

## 2017-09-08 NOTE — Progress Notes (Signed)
Gynecology Ultrasound Follow Up  Chief Complaint:  Chief Complaint  Patient presents with  . GYN follow up    U/S follow up     History of Present Illness: Patient is a 42 y.o. female who presents today for ultrasound evaluation of abnormal uterine bleeding .  Ultrasound demonstrates the following findgins Adnexa: no masses seen  Uterus: Non-enlarged, no evidence of fibroids with endometrial stripe 9.62mm Additional: no free fluid  Review of Systems: Review of Systems  Constitutional: Negative.   Gastrointestinal: Negative.   Genitourinary: Negative.     Past Medical History:  Past Medical History:  Diagnosis Date  . Anxiety   . Bipolar 1 disorder (HCC)   . Chronic back pain   . DVT (deep venous thrombosis) (HCC)   . Seizures (HCC)   . Sleep apnea   . Vascular disease     Past Surgical History:  Past Surgical History:  Procedure Laterality Date  . CESAREAN SECTION  2003  . CESAREAN SECTION  2004  . SHOULDER SURGERY      Gynecologic History:  No LMP recorded.   Family History:  No family history on file.  Social History:  Social History   Socioeconomic History  . Marital status: Married    Spouse name: Not on file  . Number of children: Not on file  . Years of education: Not on file  . Highest education level: Not on file  Occupational History  . Not on file  Social Needs  . Financial resource strain: Not on file  . Food insecurity:    Worry: Not on file    Inability: Not on file  . Transportation needs:    Medical: Not on file    Non-medical: Not on file  Tobacco Use  . Smoking status: Current Every Day Smoker    Packs/day: 1.00    Types: Cigarettes  . Smokeless tobacco: Never Used  Substance and Sexual Activity  . Alcohol use: Never    Frequency: Never  . Drug use: Not Currently    Comment: heroin, fentanyl Analog  . Sexual activity: Yes    Partners: Male    Birth control/protection: None  Lifestyle  . Physical activity:   Days per week: Not on file    Minutes per session: Not on file  . Stress: Not on file  Relationships  . Social connections:    Talks on phone: Not on file    Gets together: Not on file    Attends religious service: Not on file    Active member of club or organization: Not on file    Attends meetings of clubs or organizations: Not on file    Relationship status: Not on file  . Intimate partner violence:    Fear of current or ex partner: Not on file    Emotionally abused: Not on file    Physically abused: Not on file    Forced sexual activity: Not on file  Other Topics Concern  . Not on file  Social History Narrative  . Not on file    Allergies:  Allergies  Allergen Reactions  . Vancomycin Anaphylaxis  . Narcan [Naloxone Hcl] Nausea And Vomiting    Pt reports in the past has had hives as well.    Medications: Prior to Admission medications   Medication Sig Start Date End Date Taking? Authorizing Provider  cloNIDine (CATAPRES - DOSED IN MG/24 HR) 0.1 mg/24hr patch Place 1 patch (0.1 mg total) onto the skin every  7 (seven) days. Patient not taking: Reported on 08/29/2017 12/17/15   Emily FilbertWilliams, Jonathan E, MD  cyclobenzaprine (FLEXERIL) 10 MG tablet Take 1 tablet (10 mg total) by mouth 3 (three) times daily as needed for muscle spasms. Patient not taking: Reported on 08/29/2017 12/17/15   Emily FilbertWilliams, Jonathan E, MD  doxycycline (VIBRAMYCIN) 100 MG capsule Take 1 capsule (100 mg total) by mouth 2 (two) times daily. Patient not taking: Reported on 08/29/2017 05/19/17   Carlean JewsBoscia, Heather E, NP  lithium carbonate 300 MG capsule Take 1 capsule (300 mg total) by mouth daily. 11/04/15   Pucilowska, Jolanta B, MD  methacholine (PROVOCHOLINE) 100 MG inhaler solution Inhale into the lungs once.    [provider]  ondansetron (ZOFRAN) 4 MG tablet Take 1 tablet (4 mg total) by mouth every 8 (eight) hours as needed for nausea or vomiting. Patient not taking: Reported on 08/29/2017 10/08/15   Houston SirenSainani,  Vivek J, MD  pantoprazole (PROTONIX) 20 MG tablet Take 1 tablet (20 mg total) by mouth daily. 11/20/15 11/19/16  Jennye MoccasinQuigley, Brian S, MD  rivaroxaban (XARELTO) 20 MG TABS tablet Take 1 tablet (20 mg total) by mouth daily. Patient not taking: Reported on 08/29/2017 11/23/15   Pucilowska, Ellin GoodieJolanta B, MD  tiZANidine (ZANAFLEX) 2 MG tablet Take 1 tablet (2 mg total) by mouth 2 (two) times daily. 07/20/17   Carlean JewsBoscia, Heather E, NP    Physical Exam Vitals: Blood pressure 140/90, pulse 96, weight (!) 340 lb (154.2 kg).  General: NAD HEENT: normocephalic, anicteric Pulmonary: No increased work of breathing Extremities: no edema, erythema, or tenderness Neurologic: Grossly intact, normal gait Psychiatric: mood appropriate, affect   [place] Pelvis Transvanginal Non-ob (tv Only)  Result Date: 09/08/2017 ULTRASOUND REPORT Location: [facility] Date of Service: 09/08/2017 Indications:AUB Findings: The uterus is anteverted and measures 8.79 x 4.70 x 4.37cm. Echo texture is heterogenous without evidence of focal masses. The Endometrium measures 9.35 mm. Right Ovary measures 3.45 x 2.73 x 2.03 cm. It is normal in appearance. Left Ovary is not identified. Survey of the adnexa demonstrates no adnexal masses. There is no free fluid in the cul de sac. Impression: 1. Heterogeneous uterus suggesting possible Adenomyosis, otherwise normal gyn ultrasound Recommendations: 1.Clinical correlation with the patient's History and Physical Exam. Willette AlmaKristen Priestley, RDMS, RVT Ultrasound reviewed,  Normal gynecologic study without evidence of gynecologic or adnexal pathology. Vena AustriaAndreas Maizie Garno, MD, Evern CoreFACOG [facility], [facility] 09/08/2017, 9:46 PM    ENDOMETRIAL BIOPSY     The indications for endometrial biopsy were reviewed.   Risks of the biopsy including cramping, bleeding, infection, uterine perforation, inadequate specimen and need for additional procedures  were discussed. The patient states she understands and agrees  to undergo procedure today. Consent was signed. Time out was performed. Urine HCG was negative. A Graves speculum was placed and the cervix was brought into view.  The cervix was prepped with Betadine. A single-toothed tenaculum was  placed on the anterior lip of the cervix for traction. A 3 mm pipelle was introduced through the cervix into the endometrial cavity without difficulty to a depth of 8.5 cm, and a small amount of tissue was obtained, the resulting specime sent to pathology. The instruments were removed from the patient's vagina. Minimal bleeding from the cervix was noted. The patient tolerated the procedure well. Routine post-procedure instructions were given to the patient.  She will be contacted by phone one results become available.     GYNECOLOGY OFFICE PROCEDURE NOTE  San Jettyndrea V Kulikowski is  a 42 y.o. G4P0 here for Mirena a IUD insertion. No GYN concerns.  Last pap smear was on 08/29/2017 and was normal.  The patient is currently using nothing for contraception and her LMP is No LMP recorded..  The indication for her IUD is contraception/cycle control.  IUD Insertion Procedure Note Patient identified, informed consent performed, consent signed.   Discussed risks of irregular bleeding, cramping, infection, malpositioning, expulsion or uterine perforation of the IUD (1:1000 placements)  which may require further procedure such as laparoscopy.   Speculum placed in the vagina.  Cervix visualized.  Cleaned with Betadine x 2.  Grasped anteriorly with a single tooth tenaculum.  Uterus sounded to 8.5 cm. IUD placed per manufacturer's recommendations.  Strings trimmed to 3 cm. Tenaculum was removed, good hemostasis noted.  Patient tolerated procedure well.   Patient was given post-procedure instructions.  She was advised to have backup contraception for one week.  Patient was also asked to check IUD strings periodically and follow up in 6 weeks for IUD check.  Assessment: 42 y.o. G4P0 No  problem-specific Assessment & Plan notes found for this encounter.   Plan: Problem List Items Addressed This Visit    None    Visit Diagnoses    Abnormal uterine bleeding    -  Primary   Relevant Orders   Pathology Report   Encounter for IUD insertion          1) Abnormal uterine bleeding - laboratory evaluation negative, ultrasound with no structural abnormalities.  Most likely diagnosis AUB-O secondary to anovulatory cycles and AUB-A secondary adenomyosis.  Endometrial biopsy and Mirena IUD.  2) Patient plans IUD, and will schedule soon.  Risks and benefits of IUD discussed including the risks of irregular bleeding, cramping, infection, malpositioning, expulsion or uterine perforation of the IUD (1:1000 placements)  which may require further procedures such as laparoscopy.  IUDs while effective at preventing pregnancy do not prevent transmission of sexually transmitted diseases and use of barrier methods for this purpose was discussed.  Low overall incidence of failure with 99.7% efficacy rate in typical use.  The patient has not contraindication to IUD placement.  3) Discussed low rate of spontaneous conception at her current age, discussed [facility] would probably only offer donor eggs at this point given low yield of quality oocytes even with gonadotropin stimulation  4) Return in about 6 weeks (around 10/20/2017) for IUD string check.    Vena Austria, MD, [facility], [facility] 09/08/2017, 9:57 PM

## 2017-09-09 ENCOUNTER — Telehealth: Payer: Self-pay

## 2017-09-09 NOTE — Telephone Encounter (Signed)
Vonna KotykJay w/[facility] received a specimen for testing. Needs to verify some information on it. Cb#[phone]

## 2017-09-12 ENCOUNTER — Ambulatory Visit (INDEPENDENT_AMBULATORY_CARE_PROVIDER_SITE_OTHER): Payer: 59 | Admitting: Vascular Surgery

## 2017-09-12 ENCOUNTER — Encounter (INDEPENDENT_AMBULATORY_CARE_PROVIDER_SITE_OTHER): Payer: Self-pay | Admitting: Vascular Surgery

## 2017-09-12 VITALS — BP 144/82 | HR 72 | Resp 17 | Ht 67.0 in | Wt 348.8 lb

## 2017-09-12 DIAGNOSIS — M79604 Pain in right leg: Secondary | ICD-10-CM | POA: Insufficient documentation

## 2017-09-12 DIAGNOSIS — M79605 Pain in left leg: Secondary | ICD-10-CM | POA: Diagnosis not present

## 2017-09-12 DIAGNOSIS — Z86718 Personal history of other venous thrombosis and embolism: Secondary | ICD-10-CM | POA: Diagnosis not present

## 2017-09-12 DIAGNOSIS — R600 Localized edema: Secondary | ICD-10-CM | POA: Insufficient documentation

## 2017-09-12 DIAGNOSIS — F17200 Nicotine dependence, unspecified, uncomplicated: Secondary | ICD-10-CM

## 2017-09-12 DIAGNOSIS — F1123 Opioid dependence with withdrawal: Secondary | ICD-10-CM

## 2017-09-12 DIAGNOSIS — F314 Bipolar disorder, current episode depressed, severe, without psychotic features: Secondary | ICD-10-CM | POA: Diagnosis not present

## 2017-09-12 DIAGNOSIS — F1193 Opioid use, unspecified with withdrawal: Secondary | ICD-10-CM
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
## 2017-09-12 NOTE — Telephone Encounter (Signed)
Source Endometrial Biopsy Given to [facility]

## 2017-09-14 LAB — PATHOLOGY

## 2017-10-17 ENCOUNTER — Encounter (INDEPENDENT_AMBULATORY_CARE_PROVIDER_SITE_OTHER): Payer: 59

## 2017-10-17 ENCOUNTER — Ambulatory Visit (INDEPENDENT_AMBULATORY_CARE_PROVIDER_SITE_OTHER): Payer: 59 | Admitting: Vascular Surgery

## 2017-10-20 ENCOUNTER — Ambulatory Visit: Payer: 59 | Admitting: Obstetrics and Gynecology

## 2017-11-28 DEATH — deceased

## 2017-12-12 ENCOUNTER — Ambulatory Visit (INDEPENDENT_AMBULATORY_CARE_PROVIDER_SITE_OTHER): Admitting: Vascular Surgery

## 2017-12-12 ENCOUNTER — Encounter (INDEPENDENT_AMBULATORY_CARE_PROVIDER_SITE_OTHER)
